# Patient Record
Sex: Female | Born: 1960 | Race: White | Hispanic: No | Marital: Married | Smoking: Never smoker
Health system: Southern US, Community
[De-identification: ages and names within clinical notes are randomized; demographics above are authoritative.]

## PROBLEM LIST (undated history)

## (undated) DIAGNOSIS — K219 Gastro-esophageal reflux disease without esophagitis: Secondary | ICD-10-CM

## (undated) DIAGNOSIS — G935 Compression of brain: Secondary | ICD-10-CM

## (undated) DIAGNOSIS — I10 Essential (primary) hypertension: Secondary | ICD-10-CM

## (undated) DIAGNOSIS — T4145XA Adverse effect of unspecified anesthetic, initial encounter: Secondary | ICD-10-CM

## (undated) DIAGNOSIS — G709 Myoneural disorder, unspecified: Secondary | ICD-10-CM

## (undated) DIAGNOSIS — T8859XA Other complications of anesthesia, initial encounter: Secondary | ICD-10-CM

## (undated) DIAGNOSIS — T41205A Adverse effect of unspecified general anesthetics, initial encounter: Secondary | ICD-10-CM

## (undated) DIAGNOSIS — Z8489 Family history of other specified conditions: Secondary | ICD-10-CM

## (undated) DIAGNOSIS — R51 Headache: Secondary | ICD-10-CM

## (undated) HISTORY — DX: Compression of brain: G93.5

## (undated) HISTORY — PX: MOUTH SURGERY: SHX715

## (undated) HISTORY — DX: Essential (primary) hypertension: I10

---

## 1999-02-03 ENCOUNTER — Encounter: Admission: RE | Admit: 1999-02-03 | Discharge: 1999-02-03 | Payer: Self-pay | Admitting: Obstetrics and Gynecology

## 1999-02-03 ENCOUNTER — Encounter: Payer: Self-pay | Admitting: Obstetrics and Gynecology

## 2001-06-14 ENCOUNTER — Emergency Department (HOSPITAL_COMMUNITY): Admission: EM | Admit: 2001-06-14 | Discharge: 2001-06-14 | Payer: Self-pay | Admitting: *Deleted

## 2003-05-12 ENCOUNTER — Ambulatory Visit (HOSPITAL_COMMUNITY): Admission: RE | Admit: 2003-05-12 | Discharge: 2003-05-12 | Payer: Self-pay | Admitting: Obstetrics and Gynecology

## 2006-01-19 ENCOUNTER — Emergency Department (HOSPITAL_COMMUNITY): Admission: EM | Admit: 2006-01-19 | Discharge: 2006-01-19 | Payer: Self-pay | Admitting: Emergency Medicine

## 2006-02-21 ENCOUNTER — Ambulatory Visit (HOSPITAL_COMMUNITY): Admission: RE | Admit: 2006-02-21 | Discharge: 2006-02-21 | Payer: Self-pay | Admitting: Obstetrics and Gynecology

## 2006-09-06 ENCOUNTER — Encounter: Admission: RE | Admit: 2006-09-06 | Discharge: 2006-10-18 | Payer: Self-pay | Admitting: Neurology

## 2009-05-10 ENCOUNTER — Encounter: Admission: RE | Admit: 2009-05-10 | Discharge: 2009-05-10 | Payer: Self-pay | Admitting: Family Medicine

## 2010-03-19 ENCOUNTER — Encounter: Payer: Self-pay | Admitting: Family Medicine

## 2011-05-31 ENCOUNTER — Ambulatory Visit (INDEPENDENT_AMBULATORY_CARE_PROVIDER_SITE_OTHER): Payer: BC Managed Care – PPO | Admitting: Gynecology

## 2011-05-31 ENCOUNTER — Ambulatory Visit: Payer: Self-pay | Admitting: Gynecology

## 2011-05-31 ENCOUNTER — Encounter: Payer: Self-pay | Admitting: Gynecology

## 2011-05-31 VITALS — BP 134/72 | Ht 60.25 in | Wt 211.0 lb

## 2011-05-31 DIAGNOSIS — N926 Irregular menstruation, unspecified: Secondary | ICD-10-CM

## 2011-05-31 DIAGNOSIS — Z01419 Encounter for gynecological examination (general) (routine) without abnormal findings: Secondary | ICD-10-CM

## 2011-05-31 DIAGNOSIS — N814 Uterovaginal prolapse, unspecified: Secondary | ICD-10-CM

## 2011-05-31 DIAGNOSIS — G935 Compression of brain: Secondary | ICD-10-CM | POA: Insufficient documentation

## 2011-05-31 NOTE — Progress Notes (Addendum)
Melanie, Oliver November 06, 1960 [id number]        51 y.o. G1 P1 new patient, former patient of Dr. Katherine Roan, presents for annual exam as well as complaining of some irregular menses over the past year where she will skip a month no intermenstrual bleeding and the feeling of something falling out of her vagina with pressure symptoms.  Using condoms and withdrawal method for contraception. Had previously been on oral contraceptives but stopped them last year due to her age and hypertension history. 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 Melbourne Abts.  Past medical history,surgical history, medications, allergies, family history and social history were all reviewed and documented in the EPIC chart. ROS:  Was performed and pertinent positives and negatives are included in the history.  Exam: Sherrilyn Rist chaperone present Filed Vitals:   05/31/11 1528  BP: 134/72   General appearance  Normal Skin grossly normal Head/Neck normal with no cervical or supraclavicular adenopathy thyroid normal Lungs  clear Cardiac RR, without RMG Abdominal  soft, nontender, without masses, organomegaly or hernia Breasts  examined lying and sitting without masses, retractions, discharge or axillary adenopathy. Pelvic  Ext/BUS/vagina  normal   Cervix  normal with prolapse to the introital opening  Uterus  Anteverted with prolapse to the introital opening, normal size, shape and contour, midline and mobile nontender   Adnexa  Without masses or tenderness    Anus and perineum  normal   Rectovaginal  normal sphincter tone without palpated masses or tenderness.   Assessment/Plan:  51 y.o. female for annual exam.    1. Uterine prolapse. I reviewed the situation with the patient. Options for management include observation, pessary trial, hysterectomy were reviewed. She does not have any incontinence history of urinary or fecal. She appears to have fair support vaginally without overt cystocele  or significant rectocele. Patient wants to think of her options and will follow up with her decision. 2. Pap smear. No Pap smear was done today. Her last Pap smear was 2012. She had numerous normal reports in her chart from Dr. Elana Alm has no history of abnormal Pap smears. I discussed current screening guidelines we'll plan every 3 year Pap smears. 3. Contraception. I reviewed the need for contraception even at age 70 and various options were reviewed. I do not think estrogen-containing contraception would be a good idea given her hypertension and age. She will continue to more consistently use condoms, Plan B availability reviewed.  4. Irregular menses. She does have skips throughout the year usually just one month intermittently. No real hot flashes night sweats or other symptoms. Check baseline labs to include TSH FSH prolactin as well as a qualitative hCG as her last menses was in February. 5. Chiari malformation type I. She'll continue to follow up with Dr. Melbourne Abts in reference to this. She is asymptomatic without headaches visual changes unsteadiness on her feet or any other symptom. 6. Mammography. She is overdue for mammogram and knows the importance of scheduling this and agrees to do so. SBE monthly reviewed. 7. Colonoscopy. She did turn 50 this year and asked her to schedule a colonoscopy this coming year she agrees to arrange this. 8. Health maintenance. No blood work was done today other than the hormone levels as it is all done through her primary physician's office who she sees on a regular basis and follows her for her hypertension.     Dara Lords MD, 4:24 PM 05/31/2011

## 2011-05-31 NOTE — Patient Instructions (Signed)
Follow up for hormone level results. Think of your options as far as the uterine prolapse and follow up with this with your decision.

## 2011-06-01 LAB — TSH: TSH: 1.403 u[IU]/mL (ref 0.350–4.500)

## 2011-06-21 ENCOUNTER — Ambulatory Visit: Payer: Self-pay | Admitting: Gynecology

## 2011-06-26 ENCOUNTER — Ambulatory Visit: Payer: Self-pay | Admitting: Gynecology

## 2011-06-27 ENCOUNTER — Encounter: Payer: Self-pay | Admitting: Gynecology

## 2011-06-27 ENCOUNTER — Ambulatory Visit (INDEPENDENT_AMBULATORY_CARE_PROVIDER_SITE_OTHER): Payer: BC Managed Care – PPO | Admitting: Gynecology

## 2011-06-27 DIAGNOSIS — N814 Uterovaginal prolapse, unspecified: Secondary | ICD-10-CM

## 2011-06-27 NOTE — Progress Notes (Signed)
Patient presents wanting to proceed with hysterectomy and had some questions as far as was involved with the surgery and recovery period. She has symptomatic uterine prolapse without significant cystocele/rectocele and a normal size uterus. She has no urinary or fecal complaints.  Her FSH is elevated consistent with perimenopause and she still is having menses although becoming somewhat irregular. She does not have significant menopausal symptoms.  I reviewed with her my recommendation to proceed with total vaginal hysterectomy. Possibilities for laparoscopic assistance or TAH reviewed if unexpected adhesions or anatomic changes or complications arise. She understands that this would mean additional incisions and longer recovery. The ovarian conservation issue was reviewed particularly at her age with an elevated FSH. She still is having menses in the issues of advantage even in the perimenopausal keeping her ovaries from a health standpoint to include cardiovascular and bone health versus removing her ovaries and the issue of possible symptoms requiring ERT and the risks of ERT as well as the acceleration of cardiovascular risk and osteoporotic risk. Patient has no family history of ovarian cancer and after lengthy discussion she prefers to keep her ovaries. The expected intraoperative/postoperative courses in the recovery period was reviewed. Absolute irreversible sterility associated with hysterectomy discussed as well as sexuality and the potential for persistent orgasmic dysfunction and persistent dyspareunia reviewed.  The risks of infection, prolonged antibiotics, abscess/hematoma formation requiring reoperation to drain, hemorrhage necessitating transfusion and the risks of transfusion to include transfusion reaction, hepatitis, HIV, mad cow disease and other unknown entities all reviewed understood and accepted. The risk of inadvertent injury to internal organs, either immediately recognized or delay  recognized, including bowel, bladder, ureters, nerves necessitating major exploratory reparative surgeries and future reparative surgeries including ostomy formation, bowel resection, bladder repair, ureteral damage repair was all discussed understood and accepted. The patient wants to schedule the surgery and represent for a preoperative consult before hand. I did ask her to call Dr. Sandria Manly her neurologist in reference to her Chiari malformation just for a preoperative clearance and she agrees to do so. She does have recent blood work that shows a normal hemoglobin of 13 and normal electrolytes and renal functions.

## 2011-06-27 NOTE — Patient Instructions (Signed)
Office will call you to arrange surgery. 

## 2011-06-28 ENCOUNTER — Telehealth: Payer: Self-pay | Admitting: Gynecology

## 2011-06-28 NOTE — Telephone Encounter (Signed)
Left message to call me regarding scheduling her surgery.

## 2011-08-13 ENCOUNTER — Encounter: Payer: Self-pay | Admitting: Gynecology

## 2011-08-13 ENCOUNTER — Ambulatory Visit (INDEPENDENT_AMBULATORY_CARE_PROVIDER_SITE_OTHER): Payer: BC Managed Care – PPO | Admitting: Gynecology

## 2011-08-13 VITALS — BP 132/86

## 2011-08-13 DIAGNOSIS — N926 Irregular menstruation, unspecified: Secondary | ICD-10-CM

## 2011-08-13 DIAGNOSIS — N814 Uterovaginal prolapse, unspecified: Secondary | ICD-10-CM

## 2011-08-13 NOTE — Patient Instructions (Signed)
Followup for surgery as scheduled. 

## 2011-08-13 NOTE — H&P (Signed)
Melanie Oliver [birth year] [id number]   History and Physical   Chief complaint: irregular menses, symptomatic uterine prolapse  History of present illness: 51 y.o.  G1 P84 female with mild menstrual irregularity with skipped menses intermittently this past year with elevated FSH presented complaining of "something falling out of the vagina" with uncomfortable/unacceptable pressure symptoms.  Exam shows uterine prolapse with cervix protruding from the vagina with straining. No overt evidence of cystocele/rectocele. Options for management include observation, pessary, surgery were reviewed and patient prefers surgical treatment. Patient's admitted for [facility].   Past medical history,surgical history, medications, allergies, family history and social history were all reviewed and documented in the EPIC chart. ROS:  Was performed and pertinent positives and negatives are included in the history of present illness.  Exam: General: well developed, well nourished female, no acute distress HEENT: normal  Lungs: clear to auscultation without wheezing, rales or rhonchi  Cardiac: regular rate without rubs, murmurs or gallops  Abdomen: soft, nontender without masses, guarding, rebound, organomegaly  Pelvic: external bus vagina: normal without evidence of gross cystocele  Cervix: grossly normal with protruding from the introital opening with straining Uterus: normal size, midline and mobile, nontender with second degree prolapse Adnexa: without masses or tenderness  Rectovaginal exam within normal limits without evidence of gross rectocele    Assessment/Plan:  51 year old G1 P1 perimenopausal patient with symptomatic uterine prolapse without significant cystocele/rectocele and a normal size uterus. She has no urinary or fecal complaints. Her FSH is elevated consistent with perimenopause and she still is having menses although becoming somewhat irregular. She does not have significant menopausal  symptoms. Options for management include observation, trial of pessary and total vaginal hysterectom ywere reviewed the patient was proceed with hysterectomy. The expected intraoperative/postoperative courses as well as the recovery period was discussed with her.  Possibilities for laparoscopic assistance or TAH reviewed if unexpected adhesions or anatomic changes or complications arise. She understands that this would mean additional incisions and longer recovery. The ovarian conservation issue was reviewed particularly at her age with an elevated FSH. She still is having menses and the issues of advantage even in the perimenopause keeping her ovaries from a health standpoint to include cardiovascular and bone health versus removing her ovaries and the issue of possible symptoms requiring ERT and the risks of ERT as well as the acceleration of cardiovascular risk and osteoporotic risk. Patient has no family history of ovarian cancer and after lengthy discussion she prefers to keep her ovaries. Patient understands that she'll be at risk for benign ovarian disease possibly requiring treatment in the future as well as the risk of ovarian cancer by keeping her ovaries and she understands and accepts this. She does give me permission to remove one or both ovaries if at the time of surgery significant disease is encountered or it is my best recommendation intraoperatively to do so if she would accept this.  Absolute irreversible sterility associated with hysterectomy was discussed as well as sexuality and the potential for persistent orgasmic dysfunction and persistent dyspareunia reviewed. The risks of infection, prolonged antibiotics, abscess/hematoma formation requiring reoperation to drain, hemorrhage necessitating transfusion and the risks of transfusion to include transfusion reaction, hepatitis, HIV, mad cow disease and other unknown entities all reviewed understood and accepted. The risk of inadvertent injury to  internal organs, either immediately recognized or delay recognized, including bowel, bladder, ureters, nerves necessitating major exploratory reparative surgeries and future reparative surgeries including ostomy formation, bowel resection, bladder repair, ureteral damage repair was all  discussed understood and accepted. Patient is ready to proceed with surgery and her questions were answered to her satisfaction. She relates having been cleared by Dr. Sandria Manly as far as her Chiari malformation in regards to surgery.     Dara Lords MD, 4:28 PM 08/13/2011

## 2011-08-13 NOTE — Progress Notes (Signed)
Melanie Oliver 05/06/1960 [id number]   Preoperative consult   Chief complaint: irregular menses, symptomatic uterine prolapse  History of present illness: 51 y.o.  G1 P73 female with mild menstrual irregularity with skipped menses intermittently this past year with elevated FSH presented complaining of "something falling out of the vagina" with uncomfortable/unacceptable pressure symptoms.  Exam shows uterine prolapse with cervix protruding from the vagina with straining. No overt evidence of cystocele/rectocele. Options for management include observation, pessary, surgery were reviewed and patient prefers surgical treatment. Patient's admitted for [facility].   Past medical history,surgical history, medications, allergies, family history and social history were all reviewed and documented in the EPIC chart. ROS:  Was performed and pertinent positives and negatives are included in the history of present illness.  Exam: General: well developed, well nourished female, no acute distress HEENT: normal  Lungs: clear to auscultation without wheezing, rales or rhonchi  Cardiac: regular rate without rubs, murmurs or gallops  Abdomen: soft, nontender without masses, guarding, rebound, organomegaly  Pelvic: external bus vagina: normal without evidence of gross cystocele  Cervix: grossly normal with protruding from the introital opening with straining Uterus: normal size, midline and mobile, nontender with second degree prolapse Adnexa: without masses or tenderness  Rectovaginal exam within normal limits without evidence of gross rectocele    Assessment/Plan:  51 year old G1 P1 perimenopausal patient with symptomatic uterine prolapse without significant cystocele/rectocele and a normal size uterus. She has no urinary or fecal complaints. Her FSH is elevated consistent with perimenopause and she still is having menses although becoming somewhat irregular. She does not have significant menopausal symptoms.  Options for management include observation, trial of pessary and total vaginal hysterectom ywere reviewed the patient was proceed with hysterectomy. The expected intraoperative/postoperative courses as well as the recovery period was discussed with her.  Possibilities for laparoscopic assistance or TAH reviewed if unexpected adhesions or anatomic changes or complications arise. She understands that this would mean additional incisions and longer recovery. The ovarian conservation issue was reviewed particularly at her age with an elevated FSH. She still is having menses and the issues of advantage even in the perimenopause keeping her ovaries from a health standpoint to include cardiovascular and bone health versus removing her ovaries and the issue of possible symptoms requiring ERT and the risks of ERT as well as the acceleration of cardiovascular risk and osteoporotic risk. Patient has no family history of ovarian cancer and after lengthy discussion she prefers to keep her ovaries. Patient understands that she'll be at risk for benign ovarian disease possibly requiring treatment in the future as well as the risk of ovarian cancer by keeping her ovaries and she understands and accepts this. She does give me permission to remove one or both ovaries if at the time of surgery significant disease is encountered or it is my best recommendation intraoperatively to do so if she would accept this.  Absolute irreversible sterility associated with hysterectomy was discussed as well as sexuality and the potential for persistent orgasmic dysfunction and persistent dyspareunia reviewed. The risks of infection, prolonged antibiotics, abscess/hematoma formation requiring reoperation to drain, hemorrhage necessitating transfusion and the risks of transfusion to include transfusion reaction, hepatitis, HIV, mad cow disease and other unknown entities all reviewed understood and accepted. The risk of inadvertent injury to internal  organs, either immediately recognized or delay recognized, including bowel, bladder, ureters, nerves necessitating major exploratory reparative surgeries and future reparative surgeries including ostomy formation, bowel resection, bladder repair, ureteral damage repair was all discussed  understood and accepted. Patient is ready to proceed with surgery and her questions were answered to her satisfaction. She relates having been cleared by Dr. Sandria Manly as far as her Chiari malformation in regards to surgery.     Dara Lords MD, 4:18 PM 08/13/2011

## 2011-08-17 ENCOUNTER — Encounter (HOSPITAL_COMMUNITY): Payer: Self-pay | Admitting: Pharmacist

## 2011-08-22 ENCOUNTER — Telehealth: Payer: Self-pay | Admitting: *Deleted

## 2011-08-22 NOTE — Telephone Encounter (Signed)
Pt informed with the below note, pt will watch for now.

## 2011-08-22 NOTE — Telephone Encounter (Signed)
Unless it's a recurrent issue then I would watch for now. The issue if it would be recurrent ss whether she would want to have bladder surgery at the time of her hysterectomy. If she wants to consider that then we would have to cancel her surgery have her see the urologist and reschedule surgery if they feel bladder surgery indicated we could do as a combined procedure. Alternative would be to monitor her symptoms now if it would be an issue in the future she could have a separate bladder surgery.

## 2011-08-22 NOTE — Telephone Encounter (Signed)
Pt is scheduled for hysterectomy on 08/27/11, pt said at last couple of office visits you asked if she had any incontinence problems. Pt said she noticed this weekend while laugh she did have some urine leakage. Pt wanted me to relay this information to you. Please advise

## 2011-08-24 ENCOUNTER — Encounter (HOSPITAL_COMMUNITY)
Admission: RE | Admit: 2011-08-24 | Discharge: 2011-08-24 | Disposition: A | Discharge status: Home / Self Care | Payer: BC Managed Care – PPO | Source: Ambulatory Visit | Attending: Gynecology | Admitting: Gynecology

## 2011-08-24 ENCOUNTER — Other Ambulatory Visit: Payer: Self-pay

## 2011-08-24 ENCOUNTER — Encounter (HOSPITAL_COMMUNITY): Payer: Self-pay

## 2011-08-24 HISTORY — DX: Adverse effect of unspecified anesthetic, initial encounter: T41.45XA

## 2011-08-24 HISTORY — DX: Adverse effect of unspecified general anesthetics, initial encounter: T41.205A

## 2011-08-24 HISTORY — DX: Headache: R51

## 2011-08-24 HISTORY — DX: Gastro-esophageal reflux disease without esophagitis: K21.9

## 2011-08-24 HISTORY — DX: Family history of other specified conditions: Z84.89

## 2011-08-24 HISTORY — DX: Myoneural disorder, unspecified: G70.9

## 2011-08-24 HISTORY — DX: Other complications of anesthesia, initial encounter: T88.59XA

## 2011-08-24 LAB — SURGICAL PCR SCREEN
MRSA, PCR: NEGATIVE
Staphylococcus aureus: NEGATIVE

## 2011-08-24 LAB — CBC
MCV: 89.2 fL (ref 78.0–100.0)
Platelets: 384 10*3/uL (ref 150–400)
RDW: 12.7 % (ref 11.5–15.5)
WBC: 10.8 10*3/uL — ABNORMAL HIGH (ref 4.0–10.5)

## 2011-08-24 LAB — COMPREHENSIVE METABOLIC PANEL
ALT: 22 U/L (ref 0–35)
AST: 22 U/L (ref 0–37)
Alkaline Phosphatase: 92 U/L (ref 39–117)
Chloride: 97 mEq/L (ref 96–112)
GFR calc non Af Amer: >90 mL/min (ref >90)
Glucose, Bld: 100 mg/dL — ABNORMAL HIGH (ref 70–99)
Total Bilirubin: 0.9 mg/dL (ref 0.3–1.2)
Total Protein: 7.5 g/dL (ref 6.0–8.3)

## 2011-08-24 NOTE — Patient Instructions (Signed)
YOUR PROCEDURE IS SCHEDULED ON:08/27/11  ENTER THROUGH THE MAIN ENTRANCE OF [facility] AT:6am  USE DESK PHONE AND DIAL [phone] TO INFORM us OF YOUR ARRIVAL  CALL [phone] IF YOU HAVE ANY QUESTIONS OR PROBLEMS PRIOR TO YOUR ARRIVAL.  REMEMBER: DO NOT EAT OR DRINK AFTER MIDNIGHT : Sunday     YOU MAY BRUSH YOUR TEETH THE MORNING OF SURGERY   TAKE THESE MEDICINES THE DAY OF SURGERY WITH SIP OF WATER:BP med, and Nexium   DO NOT WEAR JEWELRY, EYE MAKEUP, LIPSTICK OR DARK FINGERNAIL POLISH DO NOT WEAR LOTIONS  DO NOT SHAVE FOR 48 HOURS PRIOR TO SURGERY  YOU WILL NOT BE ALLOWED TO DRIVE YOURSELF HOME.

## 2011-08-24 NOTE — Pre-Procedure Instructions (Signed)
Dr. Sheral Apley notified of pt having dx of Chiari malformation and per her neurologist, she should avoid having her neck and head over extended during intubation.

## 2011-08-26 MED ORDER — DEXTROSE 5 % IV SOLN
2.0000 g | INTRAVENOUS | Status: AC
  Administered 2011-08-27: 2 g via INTRAVENOUS
  Filled 2011-08-26: qty 2

## 2011-08-27 ENCOUNTER — Encounter (HOSPITAL_COMMUNITY): Payer: Self-pay | Admitting: Anesthesiology

## 2011-08-27 ENCOUNTER — Encounter (HOSPITAL_COMMUNITY): Payer: Self-pay | Admitting: *Deleted

## 2011-08-27 ENCOUNTER — Encounter (HOSPITAL_COMMUNITY)
Admission: RE | Disposition: A | Discharge status: Home / Self Care | Payer: Self-pay | Source: Ambulatory Visit | Attending: Gynecology

## 2011-08-27 ENCOUNTER — Ambulatory Visit (HOSPITAL_COMMUNITY): Payer: BC Managed Care – PPO | Admitting: Anesthesiology

## 2011-08-27 ENCOUNTER — Ambulatory Visit (HOSPITAL_COMMUNITY)
Admission: RE | Admit: 2011-08-27 | Discharge: 2011-08-28 | Disposition: A | Discharge status: Home / Self Care | Payer: BC Managed Care – PPO | Source: Ambulatory Visit | Attending: Gynecology | Admitting: Gynecology

## 2011-08-27 DIAGNOSIS — N814 Uterovaginal prolapse, unspecified: Secondary | ICD-10-CM

## 2011-08-27 DIAGNOSIS — D259 Leiomyoma of uterus, unspecified: Secondary | ICD-10-CM | POA: Insufficient documentation

## 2011-08-27 DIAGNOSIS — Z01812 Encounter for preprocedural laboratory examination: Secondary | ICD-10-CM | POA: Insufficient documentation

## 2011-08-27 DIAGNOSIS — Z01818 Encounter for other preprocedural examination: Secondary | ICD-10-CM | POA: Insufficient documentation

## 2011-08-27 HISTORY — PX: VAGINAL HYSTERECTOMY: SHX2639

## 2011-08-27 SURGERY — HYSTERECTOMY, VAGINAL
Anesthesia: General | Site: Vagina | Wound class: Clean Contaminated

## 2011-08-27 MED ORDER — HYDROMORPHONE HCL PF 1 MG/ML IJ SOLN
INTRAMUSCULAR | Status: AC
  Administered 2011-08-27: 0.25 mg via INTRAVENOUS
  Filled 2011-08-27: qty 1

## 2011-08-27 MED ORDER — KETOROLAC TROMETHAMINE 30 MG/ML IJ SOLN: 30.0000 mg | Freq: Four times a day (QID) | INTRAMUSCULAR | Status: DC

## 2011-08-27 MED ORDER — ROCURONIUM BROMIDE 50 MG/5ML IV SOLN
INTRAVENOUS | Status: AC
  Filled 2011-08-27: qty 1

## 2011-08-27 MED ORDER — SCOPOLAMINE 1 MG/3DAYS TD PT72
MEDICATED_PATCH | TRANSDERMAL | Status: AC
  Administered 2011-08-27: 1.5 mg via TRANSDERMAL
  Filled 2011-08-27: qty 1

## 2011-08-27 MED ORDER — PROPOFOL 10 MG/ML IV EMUL
INTRAVENOUS | Status: DC | PRN
  Administered 2011-08-27: 160 mg via INTRAVENOUS

## 2011-08-27 MED ORDER — MIDAZOLAM HCL 5 MG/5ML IJ SOLN
INTRAMUSCULAR | Status: DC | PRN
  Administered 2011-08-27: 2 mg via INTRAVENOUS

## 2011-08-27 MED ORDER — MIDAZOLAM HCL 2 MG/2ML IJ SOLN
INTRAMUSCULAR | Status: AC
  Filled 2011-08-27: qty 2

## 2011-08-27 MED ORDER — ONDANSETRON HCL 4 MG/2ML IJ SOLN
INTRAMUSCULAR | Status: DC | PRN
  Administered 2011-08-27: 4 mg via INTRAVENOUS

## 2011-08-27 MED ORDER — OXYCODONE-ACETAMINOPHEN 5-325 MG PO TABS
1.0000 | ORAL_TABLET | ORAL | Status: DC | PRN
  Administered 2011-08-27 – 2011-08-28 (×4): 1 via ORAL
  Filled 2011-08-27 (×4): qty 1

## 2011-08-27 MED ORDER — LIDOCAINE HCL (CARDIAC) 20 MG/ML IV SOLN
INTRAVENOUS | Status: AC
  Filled 2011-08-27: qty 5

## 2011-08-27 MED ORDER — LACTATED RINGERS IV SOLN
INTRAVENOUS | Status: DC
  Administered 2011-08-27 (×3): via INTRAVENOUS

## 2011-08-27 MED ORDER — GLYCOPYRROLATE 0.2 MG/ML IJ SOLN
INTRAMUSCULAR | Status: DC | PRN
  Administered 2011-08-27: 0.1 mg via INTRAVENOUS
  Administered 2011-08-27: 1 mg via INTRAVENOUS

## 2011-08-27 MED ORDER — SCOPOLAMINE 1 MG/3DAYS TD PT72
1.0000 | MEDICATED_PATCH | Freq: Once | TRANSDERMAL | Status: DC
  Administered 2011-08-27: 1.5 mg via TRANSDERMAL

## 2011-08-27 MED ORDER — FENTANYL CITRATE 0.05 MG/ML IJ SOLN
INTRAMUSCULAR | Status: AC
  Filled 2011-08-27: qty 5

## 2011-08-27 MED ORDER — GLYCOPYRROLATE 0.2 MG/ML IJ SOLN
INTRAMUSCULAR | Status: AC
  Filled 2011-08-27: qty 2

## 2011-08-27 MED ORDER — LIDOCAINE HCL (CARDIAC) 20 MG/ML IV SOLN
INTRAVENOUS | Status: DC | PRN
  Administered 2011-08-27: 60 mg via INTRAVENOUS

## 2011-08-27 MED ORDER — DIPHENHYDRAMINE HCL 25 MG PO CAPS
50.0000 mg | ORAL_CAPSULE | Freq: Four times a day (QID) | ORAL | Status: DC | PRN
  Filled 2011-08-27: qty 1

## 2011-08-27 MED ORDER — HYDROMORPHONE HCL PF 1 MG/ML IJ SOLN
0.2500 mg | INTRAMUSCULAR | Status: DC | PRN
  Administered 2011-08-27 (×2): 0.25 mg via INTRAVENOUS

## 2011-08-27 MED ORDER — ROCURONIUM BROMIDE 100 MG/10ML IV SOLN
INTRAVENOUS | Status: DC | PRN
  Administered 2011-08-27: 35 mg via INTRAVENOUS
  Administered 2011-08-27: 10 mg via INTRAVENOUS
  Administered 2011-08-27: 5 mg via INTRAVENOUS

## 2011-08-27 MED ORDER — ONDANSETRON HCL 4 MG/2ML IJ SOLN
INTRAMUSCULAR | Status: AC
  Filled 2011-08-27: qty 2

## 2011-08-27 MED ORDER — DEXTROSE-NACL 5-0.9 % IV SOLN: INTRAVENOUS | Status: DC

## 2011-08-27 MED ORDER — LIDOCAINE-EPINEPHRINE 1 %-1:100000 IJ SOLN
INTRAMUSCULAR | Status: DC | PRN
  Administered 2011-08-27: 10 mL

## 2011-08-27 MED ORDER — KETOROLAC TROMETHAMINE 30 MG/ML IJ SOLN
INTRAMUSCULAR | Status: AC
  Administered 2011-08-27: 30 mg via INTRAVENOUS
  Filled 2011-08-27: qty 1

## 2011-08-27 MED ORDER — NEOSTIGMINE METHYLSULFATE 1 MG/ML IJ SOLN
INTRAMUSCULAR | Status: AC
  Filled 2011-08-27: qty 10

## 2011-08-27 MED ORDER — MORPHINE SULFATE 4 MG/ML IJ SOLN: 2.0000 mg | INTRAMUSCULAR | Status: DC | PRN

## 2011-08-27 MED ORDER — NEOSTIGMINE METHYLSULFATE 1 MG/ML IJ SOLN
INTRAMUSCULAR | Status: DC | PRN
  Administered 2011-08-27: 5 mg via INTRAVENOUS

## 2011-08-27 MED ORDER — FENTANYL CITRATE 0.05 MG/ML IJ SOLN
INTRAMUSCULAR | Status: DC | PRN
  Administered 2011-08-27: 50 ug via INTRAVENOUS
  Administered 2011-08-27: 100 ug via INTRAVENOUS

## 2011-08-27 MED ORDER — PROPOFOL 10 MG/ML IV EMUL
INTRAVENOUS | Status: AC
  Filled 2011-08-27: qty 20

## 2011-08-27 MED ORDER — KETOROLAC TROMETHAMINE 30 MG/ML IJ SOLN
15.0000 mg | Freq: Once | INTRAMUSCULAR | Status: AC | PRN
  Administered 2011-08-27: 30 mg via INTRAVENOUS

## 2011-08-27 MED ORDER — KETOROLAC TROMETHAMINE 30 MG/ML IJ SOLN
30.0000 mg | Freq: Four times a day (QID) | INTRAMUSCULAR | Status: DC
  Administered 2011-08-27 – 2011-08-28 (×3): 30 mg via INTRAVENOUS
  Filled 2011-08-27 (×3): qty 1

## 2011-08-27 MED ORDER — 0.9 % SODIUM CHLORIDE (POUR BTL) OPTIME
TOPICAL | Status: DC | PRN
  Administered 2011-08-27: 1000 mL

## 2011-08-27 MED ORDER — ONDANSETRON HCL 4 MG PO TABS: 4.0000 mg | ORAL_TABLET | Freq: Four times a day (QID) | ORAL | Status: DC | PRN

## 2011-08-27 MED ORDER — EPHEDRINE SULFATE 50 MG/ML IJ SOLN
INTRAMUSCULAR | Status: DC | PRN
  Administered 2011-08-27: 10 mg via INTRAVENOUS

## 2011-08-27 MED ORDER — DEXTROSE-NACL 5-0.9 % IV SOLN
INTRAVENOUS | Status: DC
  Administered 2011-08-27 (×2): via INTRAVENOUS

## 2011-08-27 MED ORDER — DEXAMETHASONE SODIUM PHOSPHATE 10 MG/ML IJ SOLN
INTRAMUSCULAR | Status: AC
  Filled 2011-08-27: qty 1

## 2011-08-27 MED ORDER — DEXAMETHASONE SODIUM PHOSPHATE 10 MG/ML IJ SOLN
INTRAMUSCULAR | Status: DC | PRN
  Administered 2011-08-27: 10 mg via INTRAVENOUS

## 2011-08-27 SURGICAL SUPPLY — 25 items
CANISTER SUCTION 2500CC (MISCELLANEOUS) ×2 IMPLANT
CLOTH BEACON ORANGE TIMEOUT ST (SAFETY) ×2 IMPLANT
CONT PATH 16OZ SNAP LID 3702 (MISCELLANEOUS) ×2 IMPLANT
CONTAINER PREFILL 10% NBF 60ML (FORM) IMPLANT
DECANTER SPIKE VIAL GLASS SM (MISCELLANEOUS) ×2 IMPLANT
GLOVE BIO SURGEON STRL SZ7.5 (GLOVE) ×4 IMPLANT
GLOVE BIOGEL PI IND STRL 6.5 (GLOVE) ×1 IMPLANT
GLOVE BIOGEL PI INDICATOR 6.5 (GLOVE) ×1
GOWN PREVENTION PLUS LG XLONG (DISPOSABLE) ×6 IMPLANT
GOWN STRL REIN XL XLG (GOWN DISPOSABLE) ×4 IMPLANT
NEEDLE HYPO 22GX1.5 SAFETY (NEEDLE) IMPLANT
NEEDLE MAYO .5 CIRCLE (NEEDLE) IMPLANT
NEEDLE SPNL 18GX3.5 QUINCKE PK (NEEDLE) ×2 IMPLANT
NEEDLE SPNL 22GX3.5 QUINCKE BK (NEEDLE) ×2 IMPLANT
NS IRRIG 1000ML POUR BTL (IV SOLUTION) ×2 IMPLANT
PACK VAGINAL WOMENS (CUSTOM PROCEDURE TRAY) ×2 IMPLANT
SUT VIC AB 0 CT1 18XCR BRD8 (SUTURE) ×2 IMPLANT
SUT VIC AB 0 CT1 36 (SUTURE) ×2 IMPLANT
SUT VIC AB 0 CT1 8-18 (SUTURE) ×2
SUT VIC AB 2-0 SH 27 (SUTURE)
SUT VIC AB 2-0 SH 27XBRD (SUTURE) IMPLANT
SUT VICRYL 0 TIES 12 18 (SUTURE) ×2 IMPLANT
TOWEL OR 17X24 6PK STRL BLUE (TOWEL DISPOSABLE) ×4 IMPLANT
TRAY FOLEY CATH 14FR (SET/KITS/TRAYS/PACK) ×2 IMPLANT
WATER STERILE IRR 1000ML POUR (IV SOLUTION) IMPLANT

## 2011-08-27 NOTE — Anesthesia Preprocedure Evaluation (Addendum)
Anesthesia Evaluation  Patient identified by MRN, date of birth, ID band Patient awake    Reviewed: Allergy & Precautions, H&P , NPO status , Patient's Chart, lab work & pertinent test results, reviewed documented beta blocker date and time   Airway Mallampati: I TM Distance: >3 FB Neck ROM: full    Dental  (+) Teeth Intact   Pulmonary neg pulmonary ROS,  breath sounds clear to auscultation  Pulmonary exam normal       Cardiovascular hypertension (took BP med today), On Medications Rhythm:regular Rate:Normal     Neuro/Psych  Headaches (migraines), Chiari I malformation.  Asymptomatic at present.  Per neurologist, anesthesia should refrain from overextending her head and neck during intubation. negative psych ROS   GI/Hepatic negative GI ROS, Neg liver ROS, GERD- (took nexium today)  Medicated,  Endo/Other  Morbid obesity  Renal/GU negative Renal ROS     Musculoskeletal   Abdominal   Peds  Hematology negative hematology ROS (+)   Anesthesia Other Findings Caution with neck positioning during intubation  Reproductive/Obstetrics negative OB ROS                         Anesthesia Physical Anesthesia Plan  ASA: III  Anesthesia Plan: General ETT   Post-op Pain Management:    Induction:   Airway Management Planned:   Additional Equipment:   Intra-op Plan:   Post-operative Plan:   Informed Consent: I have reviewed the patients History and Physical, chart, labs and discussed the procedure including the risks, benefits and alternatives for the proposed anesthesia with the patient or authorized representative who has indicated his/her understanding and acceptance.   Dental Advisory Given  Plan Discussed with: CRNA and Surgeon  Anesthesia Plan Comments:         Anesthesia Quick Evaluation

## 2011-08-27 NOTE — Anesthesia Postprocedure Evaluation (Signed)
  Anesthesia Post-op Note  Patient: Melanie Oliver  Procedure(s) Performed: Procedure(s) (LRB): HYSTERECTOMY VAGINAL (N/A)  Patient Location: [facility]  Anesthesia Type: General  Level of Consciousness: sedated  Airway and Oxygen Therapy: Patient Spontanous Breathing and Patient connected to nasal cannula oxygen  Post-op Pain: mild  Post-op Assessment: Post-op Vital signs reviewed  Post-op Vital Signs: Reviewed and stable  Complications: No apparent anesthesia complications

## 2011-08-27 NOTE — Anesthesia Postprocedure Evaluation (Signed)
  Anesthesia Post-op Note  Patient: Melanie Oliver  Procedure(s) Performed: Procedure(s) (LRB): HYSTERECTOMY VAGINAL (N/A)  Patient Location: PACU  Anesthesia Type: General  Level of Consciousness: awake, alert  and oriented  Airway and Oxygen Therapy: Patient Spontanous Breathing  Post-op Pain: mild  Post-op Assessment: Post-op Vital signs reviewed, Patient's Cardiovascular Status Stable, Respiratory Function Stable, Patent Airway, No signs of Nausea or vomiting and Pain level controlled  Post-op Vital Signs: Reviewed and stable  Complications: No apparent anesthesia complications

## 2011-08-27 NOTE — Op Note (Signed)
Melanie Oliver 1960/12/01 [id number]   Post Operative Note   Date of surgery:  08/27/2011  Pre Op Dx:  Uterine prolapse  Post Op Dx:  Uterine prolapse  Procedure:  Total vaginal hysterectomy  Surgeon:  Dara Lords  Assistant:  Reynaldo Minium  Anesthesia:  General  EBL:  50 cc  Complications:  None  Specimen:  uterus to pathology  Findings: EUA:  External BUS vagina with cervix at introitus opening. Cervix grossly normal. Uterus normal size midline mobile. Adnexa without masses.   Operative:  Uterus grossly normal. Right and left fallopian tubes normal. Right and left ovaries normal. Cul-de-sac grossly normal to limited inspection without evidence of endometriosis or adhesions.  Procedure:  Patient was taken to the operating room, underwent general anesthesia, placed in the dorsolithotomy position, received a vaginal/perineal preparation with Betadine solution, EUA performed and a Foley catheter was placed. The patient was draped in the usual fashion and a time out was performed by the surgical team. The cervix was visualized with a weighted speculum and the anterior lip grasped with a single-tooth tenaculum and the cervical mucosa was then circumferentially injected using lidocaine/epinephrine mixture a total of 10 cc. The cervical mucosa was then circumferentially incised and the paracervical planes were sharply developed. The posterior cul-de-sac was sharply entered without difficulty and a long weighted speculum was placed. The right and left uterosacral ligaments were identified, clamped, cut and ligated using 0 Vicryl suture and tagged for future reference. The anterior cul-de-sac was then sharply entered without difficulty and the uterus was then progressively freed from its attachments to clamping, cutting and ligating of the cardinal ligaments and parametrial tissues using 0 Vicryl suture.  The uterus was then delivered through the vagina and the uterine ovarian pedicles  were then clamped bilaterally and the specimen excised and sent to pathology. The uterine ovarian pedicles were then doubly ligated using 0 Vicryl suture in simple stitch followed by suture ligature. The long weighted speculum was replaced with a shorter weighted speculum, the posterior vaginal cuff grasped with an Allis clamp, the intestines packed from the cul-de-sac using a tag tell sponge and the cul-de-sac was irrigated showing adequate hemostasis. The posterior vaginal cuff was run from uterosacral ligament to uterosacral ligament using 0 Vicryl suture in a running interlocking stitch. The vaginal packing was removed, the cul-de-sac again irrigated showing adequate hemostasis and the vagina was then closed anterior to posterior in interrupted figure-of-eight 0 Vicryl sutures.  The vagina was irrigated showing adequate hemostasis. Clear yellow urine was noted in the Foley catheter. The patient was placed in the supine position, received intraoperative Toradol and was awakened without difficulty and taken to the recovery room in good condition having tolerated the procedure well.    Dara Lords MD, 8:53 AM 08/27/2011

## 2011-08-27 NOTE — Progress Notes (Signed)
DOS S/P TVH  Awake, alert with minimal pain Abd soft nontender  Results of surgery reviewed with patient and family.  Post op instructions reviewed.  Routine PO care with D/C in AM if continues well.

## 2011-08-27 NOTE — H&P (Signed)
  The patient was examined.  I reviewed the proposed surgery and consent form with the patient.  The dictated history and physical is current and accurate and all questions were answered. The patient is ready to proceed with surgery and has a realistic understanding and expectation for the outcome.   Dara Lords MD, 7:09 AM 08/27/2011

## 2011-08-27 NOTE — Anesthesia Procedure Notes (Signed)
Procedure Name: Intubation Date/Time: 08/27/2011 7:33 AM Performed by: Graciela Husbands Pre-anesthesia Checklist: Suction available, Timeout performed, Emergency Drugs available, Patient identified and Patient being monitored Patient Re-evaluated:Patient Re-evaluated prior to inductionOxygen Delivery Method: Circle system utilized Preoxygenation: Pre-oxygenation with 100% oxygen Intubation Type: IV induction Ventilation: Mask ventilation without difficulty Tube size: 7.0 mm Number of attempts: 1 Airway Equipment and Method: Stylet Placement Confirmation: ETT inserted through vocal cords under direct vision,  positive ETCO2 and breath sounds checked- equal and bilateral Secured at: 20.5 cm Tube secured with: Tape Dental Injury: Teeth and Oropharynx as per pre-operative assessment

## 2011-08-27 NOTE — Transfer of Care (Signed)
Immediate Anesthesia Transfer of Care Note  Patient: Melanie Oliver  Procedure(s) Performed: Procedure(s) (LRB): HYSTERECTOMY VAGINAL (N/A)  Patient Location: PACU  Anesthesia Type: General  Level of Consciousness: awake, alert  and oriented  Airway & Oxygen Therapy: Patient Spontanous Breathing and Patient connected to nasal cannula oxygen  Post-op Assessment: Report given to PACU RN and Post -op Vital signs reviewed and stable  Post vital signs: Reviewed and stable  Complications: No apparent anesthesia complications

## 2011-08-27 NOTE — Addendum Note (Signed)
Addendum  created 08/27/11 1419 by Algis Greenhouse, CRNA   Modules edited:Notes Section

## 2011-08-28 ENCOUNTER — Encounter (HOSPITAL_COMMUNITY): Payer: Self-pay | Admitting: Gynecology

## 2011-08-28 LAB — CBC
Platelets: 330 10*3/uL (ref 150–400)
RBC: 3.9 MIL/uL (ref 3.87–5.11)
RDW: 13.1 % (ref 11.5–15.5)
WBC: 14.9 10*3/uL — ABNORMAL HIGH (ref 4.0–10.5)

## 2011-08-28 MED ORDER — OXYCODONE-ACETAMINOPHEN 5-325 MG PO TABS: 1.0000 | ORAL_TABLET | ORAL | Status: AC | PRN

## 2011-08-28 MED ORDER — IBUPROFEN 800 MG PO TABS
800.0000 mg | ORAL_TABLET | Freq: Four times a day (QID) | ORAL | Status: AC | PRN
  Administered 2011-08-28: 800 mg via ORAL
  Filled 2011-08-28: qty 1

## 2011-08-28 MED ORDER — IBUPROFEN 800 MG PO TABS
800.0000 mg | ORAL_TABLET | Freq: Three times a day (TID) | ORAL | Status: AC | PRN
Start: 2011-08-28 — End: 2011-09-07

## 2011-08-28 NOTE — Progress Notes (Signed)
Melanie Oliver 01/29/1961 [id number]   1 Day Post-Op Procedure(s) (LRB): HYSTERECTOMY VAGINAL (N/A)  Subjective: Patient reports feels well, pain severity reported mild, yes taking PO, foley catheter in place, novoiding, yes ambulating, yespassing flatus  Objective: Afeb, VSS   EXAM General: awake, alert and cooperative Resp: rhonchi clear to auscultation bilaterally Cardio: regular rate and rhythm, S1, S2 normal, no murmur, click, rub or gallop GI: normal findings:soft, non-tender; bowel sounds normal; no masses,  no organomegaly Lower Extremities: Without swelling or tenderness Vaginal Bleeding: Reported scant  Assessment: s/p Procedure(s): HYSTERECTOMY VAGINAL: progressing well, ambulating, eating, flatus, ready for discharge after foley out and voiding.  Post op Hb 11.4  Plan: Discharge home today.  Precautions, instructions and follow up were discussed with the patient.  Prescriptions provided  Per AVS.  Patient to call the office to arrange a post-operative appointmant in 2 weeks.  Dara Lords, MD 08/28/2011 7:16 AM

## 2011-08-28 NOTE — Discharge Instructions (Signed)
°  Postoperative Instructions Hysterectomy ° °Dr. Stellan Vick and the nursing staff have discussed postoperative instructions with you.  If you have any questions please ask them before you leave the hospital, or call Dr Hamish Banks’s office at [phone].   ° °We would like to emphasize the following instructions: ° ° °  Call the office to make your follow-up appointment as recommended by Dr Rohit Deloria (usually 2 weeks). ° °  You were given a prescription, or one was ordered for you at the pharmacy you designated.  Get that prescription filled and take the medication according to instructions. ° °  You may eat a regular diet, but slowly until you start having bowel movements. ° °  Drink plenty of water daily. ° °  Nothing in the vagina (intercourse, douching, objects of any kind) until released by Dr Shavonn Convey. ° °  No driving for two weeks.  Wait to be cleared by Dr Dontae Minerva at your first post op check.  Car rides (short) are ok after several days at home, as long as you are not having significant pain, but no traveling out of town. ° °  You may shower, but no baths.  Walking up and down stairs is ok.  No heavy lifting, prolonged standing, repeated bending or any “working out” until your first post op check. ° °  Rest frequently, listen to your body and do not push yourself and overdo it. ° °  Call if: ° °o Your pain medication does not seem strong enough. °o Worsening pain or abdominal bloating °o Persistent nausea or vomiting °o Difficulty with urination or bowel movements. °o Temperature of 101 degrees or higher. °o Bleeding heavier then staining (clots or period type flow). °o Incisions become red, tender or begin to drain. °o You have any questions or concerns. °

## 2011-08-28 NOTE — Discharge Summary (Signed)
Melanie Oliver September 30, 1960 [id number]   Discharge Summary  Date of Admission:  08/27/2011  Date of Discharge:  08/28/2011  Discharge Diagnosis:  Uterine prolapse, leiomyoma  Procedure:  Procedure(s): HYSTERECTOMY VAGINAL  Pathology: Accession #: [id number] Diagnosis Uterus and cervix UTERINE CERVIX: - BENIGN TRANSFORMATION ZONE MUCOSA WITH CHRONIC CERVICITIS. - NO DYSPLASIA, ATYPIA OR MALIGNANCY IDENTIFIED. UTERINE CORPUS: ENDOMETRIUM: - PROLIFERATIVE PHASE ENDOMETRIUM. - NO HYPERPLASIA, ATYPIA OR MALIGNANCY IDENTIFIED. MYOMETRIUM: - LEIOMYOMATA (X2). - NO ATYPIA OR MALIGNANCY IDENTIFIED.   Hospital Course:  The patient underwent an uncomplicated TVH 08/27/2011.  Patient's postoperative course was uncomplicated she was discharged on postoperative day #1 ambulating well, tolerating a regular diet, voiding without difficulty with a postoperative hemoglobin of 11.4. The patient received precautions, instructions and follow up and will be seen in the office 2 weeks following discharge. She received prescriptions per AVS.    Dara Lords MD, 5:02 PM 08/28/2011

## 2011-08-31 ENCOUNTER — Ambulatory Visit (INDEPENDENT_AMBULATORY_CARE_PROVIDER_SITE_OTHER): Payer: BC Managed Care – PPO | Admitting: Gynecology

## 2011-08-31 ENCOUNTER — Encounter: Payer: Self-pay | Admitting: Gynecology

## 2011-08-31 VITALS — BP 130/90

## 2011-08-31 DIAGNOSIS — R609 Edema, unspecified: Secondary | ICD-10-CM

## 2011-08-31 NOTE — Progress Notes (Signed)
Patient is a 51 year old who 4 days ago underwent a transvaginal hysterectomy due to to uterine prolapse. Patient stayed in the hospital overnight and did well. The reason for visit today that she was concerned whether or not it was normal to feel some swelling in her lower extremities and her hands and arms. She was also concerned whether this could have been an allergic reaction to the Percocet or Motrin. She denied any shortness of breath or any chest pain or any back discomfort, fever chills or nausea or vomiting. Review of patient's  I/O's indicated she received approximately 2 L of IV fluids and had adequate urine output postop. She does have history of hypertension and is on lisinopril 10-12.5 mg daily. Patient denied any vaginal bleeding or abdominal discomfort.  Exam: Blood pressure 130/90 Lungs: Clear to auscultation Rogers or wheezes Heart: Regular rate and rhythm no murmurs or gallops Abdomen soft nontender no rebound or guarding Back: No CVA tenderness Extremities: Negative Homans sign trace edema  Assessment/plan: Patient was reassured that the minimal edema that she has experienced may be from fluid shift from her surgery and with ambulation this will begin to correct itself. She was reminded to take her lisinopril and to followup with Dr. Audie Box in 2 weeks for a postop visit. She stopped her Percocet and Motrin taking whether that could contribute to the above symptoms I reassured her that there is no association. She's is pleased. with her surgery and is doing well.

## 2011-09-07 ENCOUNTER — Ambulatory Visit (INDEPENDENT_AMBULATORY_CARE_PROVIDER_SITE_OTHER): Payer: BC Managed Care – PPO | Admitting: Gynecology

## 2011-09-07 ENCOUNTER — Encounter: Payer: Self-pay | Admitting: Gynecology

## 2011-09-07 DIAGNOSIS — Z9889 Other specified postprocedural states: Secondary | ICD-10-CM

## 2011-09-07 DIAGNOSIS — R109 Unspecified abdominal pain: Secondary | ICD-10-CM

## 2011-09-07 LAB — URINALYSIS W MICROSCOPIC + REFLEX CULTURE
Casts: NONE SEEN
Crystals: NONE SEEN
Nitrite: NEGATIVE
Specific Gravity, Urine: 1.02 (ref 1.005–1.030)
Urobilinogen, UA: 0.2 mg/dL (ref 0.0–1.0)
pH: 6 (ref 5.0–8.0)

## 2011-09-07 NOTE — Progress Notes (Signed)
Patient presents for two-week postoperative check status post TVH doing well. She does note some bladder pressure symptoms when her bladder is full but no frequency, dysuria, fever, chills, low back pain. Was seen a week after surgery due to fluid retention and this has all resolved.  Exam with Sherrilyn Rist Asst. Abdomen soft nontender without masses guarding rebound organomegaly. Pelvic external BUS vagina normal with cuff healing nicely. Bimanual without masses or tenderness.  Assessment and plan: Normal postop check status post TVH. Doing well. Urinalysis contaminated. Will await culture and treat as indicated. Patient will slowly resume activities, maintain pelvic rest and follow up in 2 weeks. Pathology reviewed: Accession #: [id number] FINAL DIAGNOSIS Diagnosis Uterus and cervix UTERINE CERVIX: - BENIGN TRANSFORMATION ZONE MUCOSA WITH CHRONIC CERVICITIS. - NO DYSPLASIA, ATYPIA OR MALIGNANCY IDENTIFIED. UTERINE CORPUS: ENDOMETRIUM: - PROLIFERATIVE PHASE ENDOMETRIUM. - NO HYPERPLASIA, ATYPIA OR MALIGNANCY IDENTIFIED. MYOMETRIUM: - LEIOMYOMATA (X2). - NO ATYPIA OR MALIGNANCY IDENTIFIED.

## 2011-09-07 NOTE — Patient Instructions (Signed)
Followup in 2 weeks for your next postoperative checkup. 

## 2011-09-09 LAB — URINE CULTURE: Colony Count: 75000

## 2011-09-25 ENCOUNTER — Ambulatory Visit (INDEPENDENT_AMBULATORY_CARE_PROVIDER_SITE_OTHER): Payer: BC Managed Care – PPO | Admitting: Gynecology

## 2011-09-25 ENCOUNTER — Encounter: Payer: Self-pay | Admitting: Gynecology

## 2011-09-25 DIAGNOSIS — R42 Dizziness and giddiness: Secondary | ICD-10-CM

## 2011-09-25 DIAGNOSIS — Z9889 Other specified postprocedural states: Secondary | ICD-10-CM

## 2011-09-25 MED ORDER — SCOPOLAMINE 1 MG/3DAYS TD PT72: 1.0000 | MEDICATED_PATCH | TRANSDERMAL | Status: AC

## 2011-09-25 NOTE — Progress Notes (Signed)
4 weeks postop status post TVH doing well.  Exam was Sherrilyn Rist assistant Pelvic: External BUS vagina with cuff healing nicely. Bimanual without masses or tenderness.  Assessment and plan: 1 month postop status post TVH doing well. We'll slowly resume all activities with the exception of pelvic rest for another 2 weeks. He can return to work next week initially half days' time several and then full-time work. Assuming she continues well she'll see me in April 2014 when she is due for her annual exam.  She does have an issue with intermittent vertigo and notes that the scopolamine patch seems to really help. She asked if I could prescribe this for her. I prescribed the scopolamine patch to kits with one refill. Instructions as far as hand washing avoiding eye contact reviewed. Side effect profile and risks also reviewed.

## 2011-09-25 NOTE — Patient Instructions (Signed)
Slowly resume all normal activities. Continue pelvic rest for another 2 weeks. Follow up April 2014 for your annual exam.

## 2011-11-30 ENCOUNTER — Encounter: Payer: Self-pay | Admitting: Gynecology

## 2011-11-30 NOTE — Progress Notes (Signed)
Disability form completed and faxed back to patient at her request.  Copy is in e-chart.

## 2013-10-15 NOTE — H&P (Signed)
  NTS SOAP Note  Vital Signs:  Vitals as of: 0/96/2836: Systolic 629: Diastolic 98: Heart Rate 73: Temp 97.47F: Height 63ft 1in: Weight 215Lbs 0 Ounces: BMI 40.62  BMI : 40.62 kg/m2  Subjective: This 53 Years 53 Months old Female presents for a colonoscopy.  Never has had one.  No known immediate family h/o colon cancer.  Did have one episode of blood per rectum four weeeks ago.  Does have intermittent bowel urgency.  Review of Symptoms:  Constitutional:unremarkable   Head:unremarkable    Eyes:unremarkable   sinus Cardiovascular:  unremarkable   Respiratory:unremarkable   Gastrointestin    heartburn Genitourinary:unremarkable     Musculoskeletal:unremarkable   Skin:unremarkable Hematolgic/Lymphatic:unremarkable     Allergic/Immunologic:unremarkable     Past Medical History:    Reviewed  Past Medical History  Surgical History: TAH Medical Problems: HTN Allergies: nkda Medications: lisinopril   Social History:Reviewed  Social History  Preferred Language: English Race:  White Ethnicity: Not Hispanic / Latino Age: 53 Years 10 Months Marital Status:  M Alcohol: no   Smoking Status: Never smoker reviewed on 10/15/2013 Functional Status reviewed on 10/15/2013 ------------------------------------------------ Bathing: Normal Cooking: Normal Dressing: Normal Driving: Normal Eating: Normal Managing Meds: Normal Oral Care: Normal Shopping: Normal Toileting: Normal Transferring: Normal Walking: Normal Cognitive Status reviewed on 10/15/2013 ------------------------------------------------ Attention: Normal Decision Making: Normal Language: Normal Memory: Normal Motor: Normal Perception: Normal Problem Solving: Normal Visual and Spatial: Normal   Family History:  Reviewed  Family Health History Mother, Deceased; Cancer unspecified;  Father, Deceased; Lymphoma;     Objective Information: General:  Well  appearing, well nourished in no distress. Neck:  Supple without lymphadenopathy.  Heart:  RRR, no murmur or gallop.  Normal S1, S2.  No S3, S4.  Lungs:    CTA bilaterally, no wheezes, rhonchi, rales.  Breathing unlabored. Abdomen:Soft, NT/ND, no HSM, no masses.   deferred to procedure  Assessment:Need for screening TCS  Diagnoses: 569.3 Fresh blood passed per rectum (Hemorrhage of anus and rectum)  Procedures: 47654 - OFFICE OUTPATIENT NEW 20 MINUTES    Plan:  Scheduled for TCS on 10/27/13.   Patient Education:Alternative treatments to surgery were discussed with patient (and family).  Risks and benefits  of procedure including bleeding and perforation were fully explained to the patient (and family) who gave informed consent. Patient/family questions were addressed.  Trilyte prescribed.  Follow-up:Pending Surgery

## 2013-10-27 ENCOUNTER — Encounter (HOSPITAL_COMMUNITY): Payer: Self-pay | Admitting: *Deleted

## 2013-10-27 ENCOUNTER — Ambulatory Visit (HOSPITAL_COMMUNITY)
Admission: RE | Admit: 2013-10-27 | Discharge: 2013-10-27 | Disposition: A | Discharge status: Home / Self Care | Payer: BC Managed Care – PPO | Source: Ambulatory Visit | Attending: General Surgery | Admitting: General Surgery

## 2013-10-27 ENCOUNTER — Encounter (HOSPITAL_COMMUNITY)
Admission: RE | Disposition: A | Discharge status: Home / Self Care | Payer: Self-pay | Source: Ambulatory Visit | Attending: General Surgery

## 2013-10-27 DIAGNOSIS — Z1211 Encounter for screening for malignant neoplasm of colon: Secondary | ICD-10-CM | POA: Diagnosis not present

## 2013-10-27 DIAGNOSIS — K625 Hemorrhage of anus and rectum: Secondary | ICD-10-CM | POA: Insufficient documentation

## 2013-10-27 HISTORY — PX: COLONOSCOPY: SHX5424

## 2013-10-27 SURGERY — COLONOSCOPY
Anesthesia: Moderate Sedation

## 2013-10-27 MED ORDER — SODIUM CHLORIDE 0.9 % IV SOLN
INTRAVENOUS | Status: DC
  Administered 2013-10-27: 08:00:00 via INTRAVENOUS

## 2013-10-27 MED ORDER — MIDAZOLAM HCL 5 MG/5ML IJ SOLN
INTRAMUSCULAR | Status: DC | PRN
  Administered 2013-10-27: 3 mg via INTRAVENOUS

## 2013-10-27 MED ORDER — SIMETHICONE 40 MG/0.6ML PO SUSP
ORAL | Status: DC | PRN
  Administered 2013-10-27: 08:00:00

## 2013-10-27 MED ORDER — MEPERIDINE HCL 50 MG/ML IJ SOLN
INTRAMUSCULAR | Status: DC | PRN
  Administered 2013-10-27: 50 mg via INTRAVENOUS

## 2013-10-27 MED ORDER — MEPERIDINE HCL 50 MG/ML IJ SOLN
INTRAMUSCULAR | Status: DC
Start: 2013-10-27 — End: 2013-10-27
  Filled 2013-10-27: qty 1

## 2013-10-27 MED ORDER — MIDAZOLAM HCL 5 MG/5ML IJ SOLN
INTRAMUSCULAR | Status: AC
  Filled 2013-10-27: qty 5

## 2013-10-27 NOTE — Op Note (Signed)
[facility] [address] [place], [address]   COLONOSCOPY PROCEDURE REPORT  PATIENT: Melanie Oliver, Melanie Oliver  MR#: [id number] BIRTHDATE: 10-02-1960 , 52  yrs. old GENDER: Female ENDOSCOPIST: Aviva Signs, MD REFERRED NO:[id number], John PROCEDURE DATE:  10/27/2013 PROCEDURE:   Colonoscopy, screening ASA CLASS:   Class II INDICATIONS:Average risk patient for colon cancer. MEDICATIONS: Versed 3 mg IV and Demerol 50 mg IV  DESCRIPTION OF PROCEDURE:   After the risks benefits and alternatives of the procedure were thoroughly explained, informed consent was obtained.  A digital rectal exam revealed internal hemorrhoids.   The EC-[id number] ([id number])  endoscope was introduced through the anus and advanced to the cecum, which was identified by both the appendix and ileocecal valve. No adverse events experienced.   The quality of the prep was adequate, using Trilyte The instrument was then slowly withdrawn as the colon was fully examined.      COLON FINDINGS: A normal appearing cecum, ileocecal valve, and appendiceal orifice were identified.  The ascending, hepatic flexure, transverse, splenic flexure, descending, sigmoid colon and rectum appeared unremarkable.  No polyps or cancers were seen. Retroflexion was not performed due to a narrow rectal vault. The time to cecum=2 minutes 0 seconds.  Withdrawal time=5 minutes 0 seconds.  The scope was withdrawn and the procedure completed. COMPLICATIONS: There were no complications.  ENDOSCOPIC IMPRESSION: Normal colon  RECOMMENDATIONS: Repeat Colonscopy in 10 years.   eSigned:  Aviva Signs, MD 10/27/2013 8:41 AM   cc:

## 2013-10-27 NOTE — Interval H&P Note (Signed)
History and Physical Interval Note:  10/27/2013 8:25 AM  Melanie Oliver  has presented today for surgery, with the diagnosis of screening  The various methods of treatment have been discussed with the patient and family. After consideration of risks, benefits and other options for treatment, the patient has consented to  Procedure(s): COLONOSCOPY (N/A) as a surgical intervention .  The patient's history has been reviewed, patient examined, no change in status, stable for surgery.  I have reviewed the patient's chart and labs.  Questions were answered to the patient's satisfaction.     Aviva Signs A

## 2013-10-27 NOTE — Discharge Instructions (Signed)
Hemorrhoids °Hemorrhoids are swollen veins around the rectum or anus. There are two types of hemorrhoids:  °· Internal hemorrhoids. These occur in the veins just inside the rectum. They may poke through to the outside and become irritated and painful. °· External hemorrhoids. These occur in the veins outside the anus and can be felt as a painful swelling or hard lump near the anus. °CAUSES °· Pregnancy.   °· Obesity.   °· Constipation or diarrhea.   °· Straining to have a bowel movement.   °· Sitting for long periods on the toilet. °· Heavy lifting or other activity that caused you to strain. °· Anal intercourse. °SYMPTOMS  °· Pain.   °· Anal itching or irritation.   °· Rectal bleeding.   °· Fecal leakage.   °· Anal swelling.   °· One or more lumps around the anus.   °DIAGNOSIS  °Your caregiver may be able to diagnose hemorrhoids by visual examination. Other examinations or tests that may be performed include:  °· Examination of the rectal area with a gloved hand (digital rectal exam).   °· Examination of anal canal using a small tube (scope).   °· A blood test if you have lost a significant amount of blood. °· A test to look inside the colon (sigmoidoscopy or colonoscopy). °TREATMENT °Most hemorrhoids can be treated at home. However, if symptoms do not seem to be getting better or if you have a lot of rectal bleeding, your caregiver may perform a procedure to help make the hemorrhoids get smaller or remove them completely. Possible treatments include:  °· Placing a rubber band at the base of the hemorrhoid to cut off the circulation (rubber band ligation).   °· Injecting a chemical to shrink the hemorrhoid (sclerotherapy).   °· Using a tool to burn the hemorrhoid (infrared light therapy).   °· Surgically removing the hemorrhoid (hemorrhoidectomy).   °· Stapling the hemorrhoid to block blood flow to the tissue (hemorrhoid stapling).   °HOME CARE INSTRUCTIONS  °· Eat foods with fiber, such as whole grains, beans,  nuts, fruits, and vegetables. Ask your doctor about taking products with added fiber in them (fiber supplements). °· Increase fluid intake. Drink enough water and fluids to keep your urine clear or pale yellow.   °· Exercise regularly.   °· Go to the bathroom when you have the urge to have a bowel movement. Do not wait.   °· Avoid straining to have bowel movements.   °· Keep the anal area dry and clean. Use wet toilet paper or moist towelettes after a bowel movement.   °· Medicated creams and suppositories may be used or applied as directed.   °· Only take over-the-counter or prescription medicines as directed by your caregiver.   °· Take warm sitz baths for 15-20 minutes, 3-4 times a day to ease pain and discomfort.   °· Place ice packs on the hemorrhoids if they are tender and swollen. Using ice packs between sitz baths may be helpful.   °¨ Put ice in a plastic bag.   °¨ Place a towel between your skin and the bag.   °¨ Leave the ice on for 15-20 minutes, 3-4 times a day.   °· Do not use a donut-shaped pillow or sit on the toilet for long periods. This increases blood pooling and pain.   °SEEK MEDICAL CARE IF: °· You have increasing pain and swelling that is not controlled by treatment or medicine. °· You have uncontrolled bleeding. °· You have difficulty or you are unable to have a bowel movement. °· You have pain or inflammation outside the area of the hemorrhoids. °MAKE SURE YOU: °· Understand these instructions. °·   Will watch your condition.  Will get help right away if you are not doing well or get worse. Document Released: 02/10/2000 Document Revised: 01/30/2012 Document Reviewed: 12/18/2011 [facility] Patient Information 2015 [place], [place]. This information is not intended to replace advice given to you by your health care provider. Make sure you discuss any questions you have with your health care provider. Colonoscopy, Care After These instructions give you information on caring for yourself after  your procedure. Your doctor may also give you more specific instructions. Call your doctor if you have any problems or questions after your procedure. HOME CARE  Do not drive for 24 hours.  Do not sign important papers or use machinery for 24 hours.  You may shower.  You may go back to your usual activities, but go slower for the first 24 hours.  Take rest breaks often during the first 24 hours.  Walk around or use warm packs on your belly (abdomen) if you have belly cramping or gas.  Drink enough fluids to keep your pee (urine) clear or pale yellow.  Resume your normal diet. Avoid heavy or fried foods.  Avoid drinking alcohol for 24 hours or as told by your doctor.  Only take medicines as told by your doctor. If a tissue sample (biopsy) was taken during the procedure:   Do not take aspirin or blood thinners for 7 days, or as told by your doctor.  Do not drink alcohol for 7 days, or as told by your doctor.  Eat soft foods for the first 24 hours. GET HELP IF: You still have a small amount of blood in your poop (stool) 2-3 days after the procedure. GET HELP RIGHT AWAY IF:  You have more than a small amount of blood in your poop.  You see clumps of tissue (blood clots) in your poop.  Your belly is puffy (swollen).  You feel sick to your stomach (nauseous) or throw up (vomit).  You have a fever.  You have belly pain that gets worse and medicine does not help. MAKE SURE YOU:  Understand these instructions.  Will watch your condition.  Will get help right away if you are not doing well or get worse. Document Released: 03/17/2010 Document Revised: 02/17/2013 Document Reviewed: 10/20/2012 [facility] Patient Information 2015 [place], [place]. This information is not intended to replace advice given to you by your health care provider. Make sure you discuss any questions you have with your health care provider.

## 2013-10-29 ENCOUNTER — Encounter (HOSPITAL_COMMUNITY): Payer: Self-pay | Admitting: General Surgery

## 2013-12-18 ENCOUNTER — Ambulatory Visit (HOSPITAL_COMMUNITY)
Admission: RE | Admit: 2013-12-18 | Discharge: 2013-12-18 | Disposition: A | Discharge status: Home / Self Care | Payer: BC Managed Care – PPO | Source: Ambulatory Visit | Attending: Family Medicine | Admitting: Family Medicine

## 2013-12-18 ENCOUNTER — Encounter (INDEPENDENT_AMBULATORY_CARE_PROVIDER_SITE_OTHER): Payer: Self-pay

## 2013-12-18 ENCOUNTER — Other Ambulatory Visit (HOSPITAL_COMMUNITY): Payer: Self-pay | Admitting: Family Medicine

## 2013-12-18 DIAGNOSIS — M25561 Pain in right knee: Secondary | ICD-10-CM | POA: Insufficient documentation

## 2013-12-28 ENCOUNTER — Encounter (HOSPITAL_COMMUNITY): Payer: Self-pay | Admitting: General Surgery

## 2013-12-29 ENCOUNTER — Other Ambulatory Visit (HOSPITAL_COMMUNITY)
Admission: RE | Admit: 2013-12-29 | Discharge: 2013-12-29 | Disposition: A | Discharge status: Home / Self Care | Payer: BC Managed Care – PPO | Source: Ambulatory Visit | Attending: Gynecology | Admitting: Gynecology

## 2013-12-29 ENCOUNTER — Ambulatory Visit (INDEPENDENT_AMBULATORY_CARE_PROVIDER_SITE_OTHER): Payer: BC Managed Care – PPO | Admitting: Gynecology

## 2013-12-29 ENCOUNTER — Encounter: Payer: Self-pay | Admitting: Gynecology

## 2013-12-29 VITALS — BP 124/80 | Ht 60.0 in | Wt 216.0 lb

## 2013-12-29 DIAGNOSIS — Z01419 Encounter for gynecological examination (general) (routine) without abnormal findings: Secondary | ICD-10-CM | POA: Diagnosis present

## 2013-12-29 DIAGNOSIS — N951 Menopausal and female climacteric states: Secondary | ICD-10-CM

## 2013-12-29 NOTE — Patient Instructions (Signed)
Call to Schedule your mammogram  Facilities in [place]: 1)  [facility], [address] Phone: [phone] 2)  [facility]. [address] [place] Phone: [phone] 3)  Dr. Isaiah Blakes at [facility] [address] Phone: [phone]     Mammogram A mammogram is an X-ray test to find changes in a woman's breast. You should get a mammogram if:  You are 53 years of age or older  You have risk factors.   Your doctor recommends that you have one.  BEFORE THE TEST  Do not schedule the test the week before your period, especially if your breasts are sore during this time.  On the day of your mammogram:  Wash your breasts and armpits well. After washing, do not put on any deodorant or talcum powder on until after your test.   Eat and drink as you usually do.   Take your medicines as usual.   If you are diabetic and take insulin, make sure you:   Eat before coming for your test.   Take your insulin as usual.   If you cannot keep your appointment, call before the appointment to cancel. Schedule another appointment.  TEST  You will need to undress from the waist up. You will put on a hospital gown.   Your breast will be put on the mammogram machine, and it will press firmly on your breast with a piece of plastic called a compression paddle. This will make your breast flatter so that the machine can X-ray all parts of your breast.   Both breasts will be X-rayed. Each breast will be X-rayed from above and from the side. An X-ray might need to be taken again if the picture is not good enough.   The mammogram will last about 15 to 30 minutes.  AFTER THE TEST Finding out the results of your test Ask when your test results will be ready. Make sure you get your test results.  Document Released: 05/11/2008 Document Revised: 02/01/2011 Document Reviewed: 05/11/2008 [facility] Patient  Information 2012 [place].  You may obtain a copy of any labs that were done today by logging onto MyChart as outlined in the instructions provided with your AVS (after visit summary). The office will not call with normal lab results but certainly if there are any significant abnormalities then we will contact you.   Health Maintenance, Female A healthy lifestyle and preventative care can promote health and wellness.  Maintain regular health, dental, and eye exams.  Eat a healthy diet. Foods like vegetables, fruits, whole grains, low-fat dairy products, and lean protein foods contain the nutrients you need without too many calories. Decrease your intake of foods high in solid fats, added sugars, and salt. Get information about a proper diet from your caregiver, if necessary.  Regular physical exercise is one of the most important things you can do for your health. Most adults should get at least 150 minutes of moderate-intensity exercise (any activity that increases your heart rate and causes you to sweat) each week. In addition, most adults need muscle-strengthening exercises on 2 or more days a week.   Maintain a healthy weight. The body mass index (BMI) is a screening tool to identify possible weight problems. It provides an estimate of body fat based on height and weight. Your caregiver can help determine your BMI, and can help you achieve or maintain a healthy weight. For adults  20 years and older:  A BMI below 18.5 is considered underweight.  A BMI of 18.5 to 24.9 is normal.  A BMI of 25 to 29.9 is considered overweight.  A BMI of 30 and above is considered obese.  Maintain normal blood lipids and cholesterol by exercising and minimizing your intake of saturated fat. Eat a balanced diet with plenty of fruits and vegetables. Blood tests for lipids and cholesterol should begin at age 20 and be repeated every 5 years. If your lipid or cholesterol levels are high, you are over 50, or  you are a high risk for heart disease, you may need your cholesterol levels checked more frequently.Ongoing high lipid and cholesterol levels should be treated with medicines if diet and exercise are not effective.  If you smoke, find out from your caregiver how to quit. If you do not use tobacco, do not start.  Lung cancer screening is recommended for adults aged 55 80 years who are at high risk for developing lung cancer because of a history of smoking. Yearly low-dose computed tomography (CT) is recommended for people who have at least a 30-pack-year history of smoking and are a current smoker or have quit within the past 15 years. A pack year of smoking is smoking an average of 1 pack of cigarettes a day for 1 year (for example: 1 pack a day for 30 years or 2 packs a day for 15 years). Yearly screening should continue until the smoker has stopped smoking for at least 15 years. Yearly screening should also be stopped for people who develop a health problem that would prevent them from having lung cancer treatment.  If you are pregnant, do not drink alcohol. If you are breastfeeding, be very cautious about drinking alcohol. If you are not pregnant and choose to drink alcohol, do not exceed 1 drink per day. One drink is considered to be 12 ounces (355 mL) of beer, 5 ounces (148 mL) of wine, or 1.5 ounces (44 mL) of liquor.  Avoid use of street drugs. Do not share needles with anyone. Ask for help if you need support or instructions about stopping the use of drugs.  High blood pressure causes heart disease and increases the risk of stroke. Blood pressure should be checked at least every 1 to 2 years. Ongoing high blood pressure should be treated with medicines, if weight loss and exercise are not effective.  If you are 55 to 53 years old, ask your caregiver if you should take aspirin to prevent strokes.  Diabetes screening involves taking a blood sample to check your fasting blood sugar level. This  should be done once every 3 years, after age 45, if you are within normal weight and without risk factors for diabetes. Testing should be considered at a younger age or be carried out more frequently if you are overweight and have at least 1 risk factor for diabetes.  Breast cancer screening is essential preventative care for women. You should practice "breast self-awareness." This means understanding the normal appearance and feel of your breasts and may include breast self-examination. Any changes detected, no matter how small, should be reported to a caregiver. Women in their 20s and 30s should have a clinical breast exam (CBE) by a caregiver as part of a regular health exam every 1 to 3 years. After age 40, women should have a CBE every year. Starting at age 40, women should consider having a mammogram (breast X-ray) every year. Women who have a   family history of breast cancer should talk to their caregiver about genetic screening. Women at a high risk of breast cancer should talk to their caregiver about having an MRI and a mammogram every year.  Breast cancer gene (BRCA)-related cancer risk assessment is recommended for women who have family members with BRCA-related cancers. BRCA-related cancers include breast, ovarian, tubal, and peritoneal cancers. Having family members with these cancers may be associated with an increased risk for harmful changes (mutations) in the breast cancer genes BRCA1 and BRCA2. Results of the assessment will determine the need for genetic counseling and BRCA1 and BRCA2 testing.  The Pap test is a screening test for cervical cancer. Women should have a Pap test starting at age 33. Between ages 75 and 14, Pap tests should be repeated every 2 years. Beginning at age 4, you should have a Pap test every 3 years as long as the past 3 Pap tests have been normal. If you had a hysterectomy for a problem that was not cancer or a condition that could lead to cancer, then you no longer  need Pap tests. If you are between ages 72 and 12, and you have had normal Pap tests going back 10 years, you no longer need Pap tests. If you have had past treatment for cervical cancer or a condition that could lead to cancer, you need Pap tests and screening for cancer for at least 20 years after your treatment. If Pap tests have been discontinued, risk factors (such as a new sexual partner) need to be reassessed to determine if screening should be resumed. Some women have medical problems that increase the chance of getting cervical cancer. In these cases, your caregiver may recommend more frequent screening and Pap tests.  The human papillomavirus (HPV) test is an additional test that may be used for cervical cancer screening. The HPV test looks for the virus that can cause the cell changes on the cervix. The cells collected during the Pap test can be tested for HPV. The HPV test could be used to screen women aged 84 years and older, and should be used in women of any age who have unclear Pap test results. After the age of 73, women should have HPV testing at the same frequency as a Pap test.  Colorectal cancer can be detected and often prevented. Most routine colorectal cancer screening begins at the age of 56 and continues through age 76. However, your caregiver may recommend screening at an earlier age if you have risk factors for colon cancer. On a yearly basis, your caregiver may provide home test kits to check for hidden blood in the stool. Use of a small camera at the end of a tube, to directly examine the colon (sigmoidoscopy or colonoscopy), can detect the earliest forms of colorectal cancer. Talk to your caregiver about this at age 20, when routine screening begins. Direct examination of the colon should be repeated every 5 to 10 years through age 83, unless early forms of pre-cancerous polyps or small growths are found.  Hepatitis C blood testing is recommended for all people born from [birth year]  through 1965 and any individual with known risks for hepatitis C.  Practice safe sex. Use condoms and avoid high-risk sexual practices to reduce the spread of sexually transmitted infections (STIs). Sexually active women aged 28 and younger should be checked for Chlamydia, which is a common sexually transmitted infection. Older women with new or multiple partners should also be tested for Chlamydia. Testing for  other STIs is recommended if you are sexually active and at increased risk.  Osteoporosis is a disease in which the bones lose minerals and strength with aging. This can result in serious bone fractures. The risk of osteoporosis can be identified using a bone density scan. Women ages 35 and over and women at risk for fractures or osteoporosis should discuss screening with their caregivers. Ask your caregiver whether you should be taking a calcium supplement or vitamin D to reduce the rate of osteoporosis.  Menopause can be associated with physical symptoms and risks. Hormone replacement therapy is available to decrease symptoms and risks. You should talk to your caregiver about whether hormone replacement therapy is right for you.  Use sunscreen. Apply sunscreen liberally and repeatedly throughout the day. You should seek shade when your shadow is shorter than you. Protect yourself by wearing long sleeves, pants, a wide-brimmed hat, and sunglasses year round, whenever you are outdoors.  Notify your caregiver of new moles or changes in moles, especially if there is a change in shape or color. Also notify your caregiver if a mole is larger than the size of a pencil eraser.  Stay current with your immunizations. Document Released: 08/28/2010 Document Revised: 06/09/2012 Document Reviewed: 08/28/2010 [facility] Patient Information 2014 [place].

## 2013-12-29 NOTE — Progress Notes (Signed)
MAI LONGNECKER November 28, 1960 [id number]        53 y.o.  G1P1 for annual exam.  Has not been in for 2 years. Doing well overall. Several issues noted below.  Past medical history,surgical history, problem list, medications, allergies, family history and social history were all reviewed and documented as reviewed in the EPIC chart.  ROS:  12 system ROS performed with pertinent positives and negatives included in the history, assessment and plan.   Additional significant findings :  none   Exam: Kim Counsellor Vitals:   12/29/13 1511  BP: 124/80  Height: 5' (1.524 m)  Weight: 216 lb (97.977 kg)   General appearance:  Normal affect, orientation and appearance. Skin: Grossly normal HEENT: Without gross lesions.  No cervical or supraclavicular adenopathy. Thyroid normal.  Lungs:  Clear without wheezing, rales or rhonchi Cardiac: RR, without RMG Abdominal:  Soft, nontender, without masses, guarding, rebound, organomegaly or hernia Breasts:  Examined lying and sitting without masses, retractions, discharge or axillary adenopathy. Pelvic:  Ext/BUS/vagina normal. Pap of cuff done  Adnexa  Without masses or tenderness    Anus and perineum  Normal   Rectovaginal  Normal sphincter tone without palpated masses or tenderness.    Assessment/Plan:  53 y.o. G1P1 female for annual exam .   1. Menopausal symptoms. Patient is noting some hot flushes and night sweats. Note vaginal dryness or dyspareunia. Status post TVH for prolapse 2013. Options for management to include OTC products, HRT and nonhormonal pharmacologic such as Effexor were reviewed.  I reviewed the whole issue of HRT with her to include the WHI study with increased risk of stroke, heart attack, DVT and breast cancer. The ACOG and NAMS statements for lowest dose for the shortest period of time reviewed. Transdermal versus oral first-pass effect benefit discussed.  At this point patient is not interested in prescription medication.  Would prefer to monitor for now. Will follow up if worsens or wants to rediscuss treatment options. 2. Pap 2012. Pap smear of vaginal cuff today. No history of significant abnormal Pap smears previously. Options to stop screening altogether she is status post hysterectomy for benign indications versus screening on a less frequent interval reviewed. Will readdress on an annual basis. 3. Mammography 2007.patient knows she is way overdue. She agrees to schedule. Names and numbers provided. SBE monthly reviewed. 4. Colonoscopy 2015. Repeat at their recommended interval. 5. DEXA never. Will plan further into the menopause. Increase calcium vitamin D recommendations reviewed. 6. Health maintenance. No routine blood work done and she reports this done at her primary physician's office. Recently treated for UTI. Will check urinalysis today. Followup in one year, sooner as needed.     Anastasio Auerbach MD, 3:29 PM 12/29/2013

## 2013-12-29 NOTE — Addendum Note (Signed)
Addended by: Nelva Nay on: 12/29/2013 03:47 PM   Modules accepted: Orders, SmartSet

## 2013-12-30 LAB — URINALYSIS W MICROSCOPIC + REFLEX CULTURE
BILIRUBIN URINE: NEGATIVE
Bacteria, UA: NONE SEEN
Casts: NONE SEEN
Crystals: NONE SEEN
Glucose, UA: NEGATIVE mg/dL
Hgb urine dipstick: NEGATIVE
Ketones, ur: NEGATIVE mg/dL
Leukocytes, UA: NEGATIVE
Nitrite: NEGATIVE
PROTEIN: NEGATIVE mg/dL
Specific Gravity, Urine: 1.005 (ref 1.005–1.030)
Squamous Epithelial / LPF: NONE SEEN
UROBILINOGEN UA: 0.2 mg/dL (ref 0.0–1.0)
pH: 6 (ref 5.0–8.0)

## 2013-12-31 LAB — CYTOLOGY - PAP

## 2014-04-21 ENCOUNTER — Ambulatory Visit (HOSPITAL_COMMUNITY)
Admission: RE | Admit: 2014-04-21 | Discharge: 2014-04-21 | Disposition: A | Discharge status: Home / Self Care | Payer: BC Managed Care – PPO | Source: Ambulatory Visit | Attending: Family Medicine | Admitting: Family Medicine

## 2014-04-21 ENCOUNTER — Other Ambulatory Visit (HOSPITAL_COMMUNITY): Payer: Self-pay | Admitting: Family Medicine

## 2014-04-21 DIAGNOSIS — M545 Low back pain: Secondary | ICD-10-CM | POA: Diagnosis not present

## 2015-06-29 ENCOUNTER — Ambulatory Visit (HOSPITAL_COMMUNITY)
Admission: RE | Admit: 2015-06-29 | Discharge: 2015-06-29 | Disposition: A | Discharge status: Home / Self Care | Payer: BC Managed Care – PPO | Source: Ambulatory Visit | Attending: Physician Assistant | Admitting: Physician Assistant

## 2015-06-29 ENCOUNTER — Other Ambulatory Visit (HOSPITAL_COMMUNITY): Payer: Self-pay | Admitting: Physician Assistant

## 2015-06-29 DIAGNOSIS — M79645 Pain in left finger(s): Secondary | ICD-10-CM

## 2015-06-29 DIAGNOSIS — X58XXXA Exposure to other specified factors, initial encounter: Secondary | ICD-10-CM | POA: Insufficient documentation

## 2015-06-29 DIAGNOSIS — Z1389 Encounter for screening for other disorder: Secondary | ICD-10-CM | POA: Insufficient documentation

## 2015-06-29 DIAGNOSIS — Z6834 Body mass index (BMI) 34.0-34.9, adult: Secondary | ICD-10-CM | POA: Insufficient documentation

## 2015-06-29 DIAGNOSIS — S60011A Contusion of right thumb without damage to nail, initial encounter: Secondary | ICD-10-CM | POA: Insufficient documentation

## 2015-12-23 ENCOUNTER — Ambulatory Visit (HOSPITAL_COMMUNITY)
Admission: RE | Admit: 2015-12-23 | Discharge: 2015-12-23 | Disposition: A | Discharge status: Home / Self Care | Payer: BC Managed Care – PPO | Source: Ambulatory Visit | Attending: Family Medicine | Admitting: Family Medicine

## 2015-12-23 ENCOUNTER — Other Ambulatory Visit (HOSPITAL_COMMUNITY): Payer: Self-pay | Admitting: Family Medicine

## 2015-12-23 DIAGNOSIS — M25522 Pain in left elbow: Secondary | ICD-10-CM

## 2015-12-23 DIAGNOSIS — X58XXXA Exposure to other specified factors, initial encounter: Secondary | ICD-10-CM | POA: Diagnosis not present

## 2015-12-23 DIAGNOSIS — S52125A Nondisplaced fracture of head of left radius, initial encounter for closed fracture: Secondary | ICD-10-CM | POA: Diagnosis not present

## 2015-12-26 ENCOUNTER — Telehealth: Payer: Self-pay | Admitting: Orthopaedic Surgery

## 2015-12-26 NOTE — Telephone Encounter (Signed)
We received a stat referral from [facility] on Friday afternoon after our office had closed. I called the patient's home this morning but did not get an answer.  Later I called the patient at work and got to speak with her.  She stated that she has an appointment in [place] with a Dr. Layne Benton at 1:00.

## 2017-05-14 ENCOUNTER — Other Ambulatory Visit (HOSPITAL_COMMUNITY): Payer: Self-pay | Admitting: Family Medicine

## 2017-05-14 DIAGNOSIS — Z1231 Encounter for screening mammogram for malignant neoplasm of breast: Secondary | ICD-10-CM

## 2017-05-20 ENCOUNTER — Ambulatory Visit (HOSPITAL_COMMUNITY): Payer: BC Managed Care – PPO

## 2018-12-04 ENCOUNTER — Emergency Department (HOSPITAL_COMMUNITY): Payer: BC Managed Care – PPO

## 2018-12-04 ENCOUNTER — Other Ambulatory Visit: Payer: Self-pay

## 2018-12-04 ENCOUNTER — Encounter (HOSPITAL_COMMUNITY): Payer: Self-pay

## 2018-12-04 ENCOUNTER — Emergency Department (HOSPITAL_COMMUNITY)
Admission: EM | Admit: 2018-12-04 | Discharge: 2018-12-04 | Disposition: A | Discharge status: Home / Self Care | Payer: BC Managed Care – PPO | Attending: Emergency Medicine | Admitting: Emergency Medicine

## 2018-12-04 DIAGNOSIS — R0789 Other chest pain: Secondary | ICD-10-CM | POA: Insufficient documentation

## 2018-12-04 DIAGNOSIS — Y9389 Activity, other specified: Secondary | ICD-10-CM | POA: Insufficient documentation

## 2018-12-04 DIAGNOSIS — Y999 Unspecified external cause status: Secondary | ICD-10-CM | POA: Insufficient documentation

## 2018-12-04 DIAGNOSIS — I10 Essential (primary) hypertension: Secondary | ICD-10-CM | POA: Insufficient documentation

## 2018-12-04 DIAGNOSIS — M25511 Pain in right shoulder: Secondary | ICD-10-CM | POA: Insufficient documentation

## 2018-12-04 DIAGNOSIS — Y92410 Unspecified street and highway as the place of occurrence of the external cause: Secondary | ICD-10-CM | POA: Diagnosis not present

## 2018-12-04 DIAGNOSIS — M5489 Other dorsalgia: Secondary | ICD-10-CM | POA: Diagnosis present

## 2018-12-04 MED ORDER — DICLOFENAC SODIUM 1 % TD GEL: 2.0000 g | Freq: Four times a day (QID) | TRANSDERMAL | 0 refills | Status: DC | PRN

## 2018-12-04 MED ORDER — IBUPROFEN 800 MG PO TABS: 800.0000 mg | ORAL_TABLET | Freq: Three times a day (TID) | ORAL | 0 refills | Status: DC | PRN
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
## 2018-12-04 NOTE — Discharge Instructions (Signed)

## 2019-06-12 ENCOUNTER — Ambulatory Visit: Payer: BC Managed Care – PPO | Attending: Internal Medicine

## 2019-06-12 DIAGNOSIS — Z23 Encounter for immunization: Secondary | ICD-10-CM

## 2019-06-12 NOTE — Progress Notes (Signed)
   Covid-19 Vaccination Clinic  Name:  Melanie Oliver    MRN: [id number] DOB: [birth year]  06/12/2019  [name] was observed post Covid-19 immunization for 15 minutes without incident. She was provided with Vaccine Information Sheet and instruction to access the V-Safe system.   [name] was instructed to call 911 with any severe reactions post vaccine: Marland Kitchen Difficulty breathing  . Swelling of face and throat  . A fast heartbeat  . A bad rash all over body  . Dizziness and weakness   Immunizations Administered    Name Date Dose VIS Date Route   Moderna COVID-19 Vaccine 06/12/2019 12:33 PM 0.5 mL 01/27/2019 Intramuscular   Manufacturer: Moderna   Lot: [id number]   [id number]

## 2020-04-18 ENCOUNTER — Encounter: Payer: Self-pay | Admitting: Internal Medicine

## 2020-05-19 ENCOUNTER — Ambulatory Visit (INDEPENDENT_AMBULATORY_CARE_PROVIDER_SITE_OTHER): Payer: BC Managed Care – PPO | Admitting: Internal Medicine

## 2020-05-19 ENCOUNTER — Other Ambulatory Visit: Payer: Self-pay

## 2020-05-19 ENCOUNTER — Encounter: Payer: Self-pay | Admitting: *Deleted

## 2020-05-19 ENCOUNTER — Encounter: Payer: Self-pay | Admitting: Internal Medicine

## 2020-05-19 VITALS — BP 153/88 | HR 78 | Temp 97.3°F | Ht 62.0 in | Wt 218.0 lb

## 2020-05-19 DIAGNOSIS — K219 Gastro-esophageal reflux disease without esophagitis: Secondary | ICD-10-CM

## 2020-05-19 DIAGNOSIS — R1030 Lower abdominal pain, unspecified: Secondary | ICD-10-CM | POA: Diagnosis not present

## 2020-05-19 DIAGNOSIS — E559 Vitamin D deficiency, unspecified: Secondary | ICD-10-CM

## 2020-05-19 DIAGNOSIS — R197 Diarrhea, unspecified: Secondary | ICD-10-CM | POA: Diagnosis not present

## 2020-05-19 DIAGNOSIS — K625 Hemorrhage of anus and rectum: Secondary | ICD-10-CM

## 2020-05-19 MED ORDER — CLENPIQ 10-3.5-12 MG-GM -GM/160ML PO SOLN: 1.0000 | Freq: Once | ORAL | 0 refills | Status: AC

## 2020-05-19 NOTE — Progress Notes (Signed)
Pt requested smallest volume prep

## 2020-05-19 NOTE — H&P (View-Only) (Signed)
Primary Care Physician:  Sharilyn Sites, MD Primary Gastroenterologist:  Dr. Abbey Chatters  Chief Complaint  Patient presents with  . Irritable Bowel Syndrome    Loose stools 3-5 times per day. TCS done about 5 years ago and was normal    HPI:   Melanie Oliver is a 60 y.o. female who presents to the clinic today by referral from her PCP Dr. Hilma Favors for evaluation.  Patient has chronic diarrhea that she has been dealing with for many years but has progressively worsened recently.  She states she takes as needed Imodium for this which does help though she is scared to take a medication on a daily basis.  She notes she has been on Imodium for over 2 to 3 years at this point.  She has a prescription for Lomotil but states she has not taken this as she is worried it is too strong.  Also notes some lower abdominal discomfort as well as rectal bleeding.  She states at times she will have "a lot of blood" in the toilet bowl with bowel movements.  Last colonoscopy 2015 by Dr. Arnoldo Morale was unremarkable besides internal hemorrhoids.  Also has chronic heartburn and reflux for which she takes Nexium as needed.  Appears that she had an ENT evaluation in the past with laryngoscopy findings consistent with LPR.  She was placed on Nexium at that time.  She states as long she takes the medication it does help though again she is averse to taking medications every day.  No dysphagia or odynophagia.  No melena hematochezia.  No chronic NSAID use.  Patient also notes history of vitamin D deficiency and is wanting to check this today.  Past Medical History:  Diagnosis Date  . Chiari malformation type I ([place])   . Complication of anesthesia    woke up with a migraine  . Family history of anesthesia complication    aunt wakes up with a migraine  . GERD (gastroesophageal reflux disease)   . Headache(784.0)   . Hypertension   . Neuromuscular disorder ([place])    pinched nerve in neck  . Other and unspecified general  anesthetics causing adverse effect in therapeutic use    Has Chiari malformation- per neurologist "do not over extend neck"     Past Surgical History:  Procedure Laterality Date  . COLONOSCOPY N/A 10/27/2013   Procedure: COLONOSCOPY;  Surgeon: Jamesetta So, MD;  [facility];  Service: Gastroenterology;  Laterality: N/A;  . MOUTH SURGERY    . VAGINAL HYSTERECTOMY  08/27/2011   Procedure: HYSTERECTOMY VAGINAL;  Surgeon: Anastasio Auerbach, MD;  Location: [facility];  Service: Gynecology;  Laterality: N/A;    Current Outpatient Medications  Medication Sig Dispense Refill  . esomeprazole (NEXIUM) 40 MG capsule Take 40 mg by mouth daily as needed. For heartburn    . loperamide (IMODIUM) 2 MG capsule Take by mouth as needed for diarrhea or loose stools.     No current facility-administered medications for this visit.    Allergies as of 05/19/2020 - Review Complete 05/19/2020  Allergen Reaction Noted  . Codeine Nausea And Vomiting   . Levofloxacin  05/19/2020  . Sulfa antibiotics  05/19/2020    Family History  Problem Relation Age of Onset  . Cancer Mother        brain tumor  . Cancer Father        Non Hodgkins lymphoma  . Alzheimer's disease Father   . Hypertension Sister   .  Diabetes Sister   . Diabetes Paternal Aunt   . Diabetes Maternal Grandfather   . Hypertension Paternal Grandmother   . Heart disease Paternal Grandmother   . Colon cancer Neg Hx     Social History   Socioeconomic History  . Marital status: Married    Spouse name: Not on file  . Number of children: Not on file  . Years of education: Not on file  . Highest education level: Not on file  Occupational History  . Not on file  Tobacco Use  . Smoking status: Never Smoker  . Smokeless tobacco: Never Used  Substance and Sexual Activity  . Alcohol use: No  . Drug use: No  . Sexual activity: Yes    Birth control/protection: Surgical    Comment: Hyst  Other Topics Concern  . Not on file   Social History Narrative  . Not on file   Social Determinants of Health   Financial Resource Strain: Not on file  Food Insecurity: Not on file  Transportation Needs: Not on file  Physical Activity: Not on file  Stress: Not on file  Social Connections: Not on file  Intimate Partner Violence: Not on file    Subjective: Review of Systems  Constitutional: Negative for chills and fever.  HENT: Negative for congestion and hearing loss.   Eyes: Negative for blurred vision and double vision.  Respiratory: Negative for cough and shortness of breath.   Cardiovascular: Negative for chest pain and palpitations.  Gastrointestinal: Positive for abdominal pain and diarrhea. Negative for blood in stool, constipation, heartburn, melena and vomiting.  Genitourinary: Negative for dysuria and urgency.  Musculoskeletal: Negative for joint pain and myalgias.  Skin: Negative for itching and rash.  Neurological: Negative for dizziness and headaches.  Psychiatric/Behavioral: Negative for depression. The patient is not nervous/anxious.        Objective: BP (!) 153/88   Pulse 78   Temp (!) 97.3 F (36.3 C)   Ht 5\' 2"  (1.575 m)   Wt 218 lb (98.9 kg)   LMP 06/25/2011   BMI 39.87 kg/m  Physical Exam Constitutional:      Appearance: Normal appearance. She is obese.  HENT:     Head: Normocephalic and atraumatic.  Eyes:     Extraocular Movements: Extraocular movements intact.     Conjunctiva/sclera: Conjunctivae normal.  Cardiovascular:     Rate and Rhythm: Normal rate and regular rhythm.  Pulmonary:     Effort: Pulmonary effort is normal.     Breath sounds: Normal breath sounds.  Abdominal:     General: Bowel sounds are normal.     Palpations: Abdomen is soft.  Musculoskeletal:        General: No swelling. Normal range of motion.     Cervical back: Normal range of motion and neck supple.  Skin:    General: Skin is warm and dry.     Coloration: Skin is not jaundiced.  Neurological:      General: No focal deficit present.     Mental Status: She is alert and oriented to person, place, and time.  Psychiatric:        Mood and Affect: Mood normal.        Behavior: Behavior normal.      Assessment: *GERD *Diarrhea *Rectal bleeding *Lower abdominal pain *Vitamin D deficiency  Plan: Patient's GERD not well controlled currently though patient does admit to not taking her Nexium every day.  Etiology of her diarrhea, abdominal pain unclear likely irritable bowel  syndrome but will need to rule out other conditions.  Rectal bleeding likely due to hemorrhoids as well but we will need to evaluate this.  Will schedule for EGD to evaluate for peptic ulcer disease, esophagitis, gastritis, H. Pylori, duodenitis, or other. Will also evaluate for esophageal stricture, Schatzki's ring, esophageal web or other.   At the same time we will perform colonoscopy with random colon biopsies to rule out microscopic colitis, underline inflammatory bowel disease such as Crohn's disease or ulcerative colitis, polyps, malignancy, or other.  The risks including infection, bleed, or perforation as well as benefits, limitations, alternatives and imponderables have been reviewed with the patient. Potential for esophageal dilation, biopsy, etc. have also been reviewed.  Questions have been answered. All parties agreeable.  Continue on Nexium as needed.  Depending on endoscopic evaluation, patient may need to take this more frequent.  Continue Imodium as needed for diarrhea.  I will check TSH and celiac panel today given her ongoing diarrhea.  Check vitamin D level today.  Thank you Dr. Hilma Favors for the kind referral.  05/19/2020 11:16 AM   Disclaimer: This note was dictated with voice recognition software. Similar sounding words can inadvertently be transcribed and may not be corrected upon review.

## 2020-05-19 NOTE — Progress Notes (Signed)
Primary Care Physician:  Sharilyn Sites, MD Primary Gastroenterologist:  Dr. Abbey Chatters  Chief Complaint  Patient presents with  . Irritable Bowel Syndrome    Loose stools 3-5 times per day. TCS done about 5 years ago and was normal    HPI:   Melanie Oliver is a 60 y.o. female who presents to the clinic today by referral from her PCP Dr. Hilma Favors for evaluation.  Patient has chronic diarrhea that she has been dealing with for many years but has progressively worsened recently.  She states she takes as needed Imodium for this which does help though she is scared to take a medication on a daily basis.  She notes she has been on Imodium for over 2 to 3 years at this point.  She has a prescription for Lomotil but states she has not taken this as she is worried it is too strong.  Also notes some lower abdominal discomfort as well as rectal bleeding.  She states at times she will have "a lot of blood" in the toilet bowl with bowel movements.  Last colonoscopy 2015 by Dr. Arnoldo Morale was unremarkable besides internal hemorrhoids.  Also has chronic heartburn and reflux for which she takes Nexium as needed.  Appears that she had an ENT evaluation in the past with laryngoscopy findings consistent with LPR.  She was placed on Nexium at that time.  She states as long she takes the medication it does help though again she is averse to taking medications every day.  No dysphagia or odynophagia.  No melena hematochezia.  No chronic NSAID use.  Patient also notes history of vitamin D deficiency and is wanting to check this today.  Past Medical History:  Diagnosis Date  . Chiari malformation type I ([place])   . Complication of anesthesia    woke up with a migraine  . Family history of anesthesia complication    aunt wakes up with a migraine  . GERD (gastroesophageal reflux disease)   . Headache(784.0)   . Hypertension   . Neuromuscular disorder ([place])    pinched nerve in neck  . Other and unspecified general  anesthetics causing adverse effect in therapeutic use    Has Chiari malformation- per neurologist "do not over extend neck"     Past Surgical History:  Procedure Laterality Date  . COLONOSCOPY N/A 10/27/2013   Procedure: COLONOSCOPY;  Surgeon: Jamesetta So, MD;  [facility]: [facility];  Service: Gastroenterology;  Laterality: N/A;  . MOUTH SURGERY    . VAGINAL HYSTERECTOMY  08/27/2011   Procedure: HYSTERECTOMY VAGINAL;  Surgeon: Anastasio Auerbach, MD;  Location: [facility];  Service: Gynecology;  Laterality: N/A;    Current Outpatient Medications  Medication Sig Dispense Refill  . esomeprazole (NEXIUM) 40 MG capsule Take 40 mg by mouth daily as needed. For heartburn    . loperamide (IMODIUM) 2 MG capsule Take by mouth as needed for diarrhea or loose stools.     No current facility-administered medications for this visit.    Allergies as of 05/19/2020 - Review Complete 05/19/2020  Allergen Reaction Noted  . Codeine Nausea And Vomiting   . Levofloxacin  05/19/2020  . Sulfa antibiotics  05/19/2020    Family History  Problem Relation Age of Onset  . Cancer Mother        brain tumor  . Cancer Father        Non Hodgkins lymphoma  . Alzheimer's disease Father   . Hypertension Sister   .  Diabetes Sister   . Diabetes Paternal Aunt   . Diabetes Maternal Grandfather   . Hypertension Paternal Grandmother   . Heart disease Paternal Grandmother   . Colon cancer Neg Hx     Social History   Socioeconomic History  . Marital status: Married    Spouse name: Not on file  . Number of children: Not on file  . Years of education: Not on file  . Highest education level: Not on file  Occupational History  . Not on file  Tobacco Use  . Smoking status: Never Smoker  . Smokeless tobacco: Never Used  Substance and Sexual Activity  . Alcohol use: No  . Drug use: No  . Sexual activity: Yes    Birth control/protection: Surgical    Comment: Hyst  Other Topics Concern  . Not on file   Social History Narrative  . Not on file   Social Determinants of Health   Financial Resource Strain: Not on file  Food Insecurity: Not on file  Transportation Needs: Not on file  Physical Activity: Not on file  Stress: Not on file  Social Connections: Not on file  Intimate Partner Violence: Not on file    Subjective: Review of Systems  Constitutional: Negative for chills and fever.  HENT: Negative for congestion and hearing loss.   Eyes: Negative for blurred vision and double vision.  Respiratory: Negative for cough and shortness of breath.   Cardiovascular: Negative for chest pain and palpitations.  Gastrointestinal: Positive for abdominal pain and diarrhea. Negative for blood in stool, constipation, heartburn, melena and vomiting.  Genitourinary: Negative for dysuria and urgency.  Musculoskeletal: Negative for joint pain and myalgias.  Skin: Negative for itching and rash.  Neurological: Negative for dizziness and headaches.  Psychiatric/Behavioral: Negative for depression. The patient is not nervous/anxious.        Objective: BP (!) 153/88   Pulse 78   Temp (!) 97.3 F (36.3 C)   Ht 5\' 2"  (1.575 m)   Wt 218 lb (98.9 kg)   LMP 06/25/2011   BMI 39.87 kg/m  Physical Exam Constitutional:      Appearance: Normal appearance. She is obese.  HENT:     Head: Normocephalic and atraumatic.  Eyes:     Extraocular Movements: Extraocular movements intact.     Conjunctiva/sclera: Conjunctivae normal.  Cardiovascular:     Rate and Rhythm: Normal rate and regular rhythm.  Pulmonary:     Effort: Pulmonary effort is normal.     Breath sounds: Normal breath sounds.  Abdominal:     General: Bowel sounds are normal.     Palpations: Abdomen is soft.  Musculoskeletal:        General: No swelling. Normal range of motion.     Cervical back: Normal range of motion and neck supple.  Skin:    General: Skin is warm and dry.     Coloration: Skin is not jaundiced.  Neurological:      General: No focal deficit present.     Mental Status: She is alert and oriented to person, place, and time.  Psychiatric:        Mood and Affect: Mood normal.        Behavior: Behavior normal.      Assessment: *GERD *Diarrhea *Rectal bleeding *Lower abdominal pain *Vitamin D deficiency  Plan: Patient's GERD not well controlled currently though patient does admit to not taking her Nexium every day.  Etiology of her diarrhea, abdominal pain unclear likely irritable bowel  syndrome but will need to rule out other conditions.  Rectal bleeding likely due to hemorrhoids as well but we will need to evaluate this.  Will schedule for EGD to evaluate for peptic ulcer disease, esophagitis, gastritis, H. Pylori, duodenitis, or other. Will also evaluate for esophageal stricture, Schatzki's ring, esophageal web or other.   At the same time we will perform colonoscopy with random colon biopsies to rule out microscopic colitis, underline inflammatory bowel disease such as Crohn's disease or ulcerative colitis, polyps, malignancy, or other.  The risks including infection, bleed, or perforation as well as benefits, limitations, alternatives and imponderables have been reviewed with the patient. Potential for esophageal dilation, biopsy, etc. have also been reviewed.  Questions have been answered. All parties agreeable.  Continue on Nexium as needed.  Depending on endoscopic evaluation, patient may need to take this more frequent.  Continue Imodium as needed for diarrhea.  I will check TSH and celiac panel today given her ongoing diarrhea.  Check vitamin D level today.  Thank you Dr. Hilma Favors for the kind referral.  05/19/2020 11:16 AM   Disclaimer: This note was dictated with voice recognition software. Similar sounding words can inadvertently be transcribed and may not be corrected upon review.

## 2020-05-19 NOTE — Patient Instructions (Signed)
We will schedule you for EGD to further evaluate your chronic reflux and indigestion.  At the same time we will perform colonoscopy to evaluate your diarrhea and rectal bleeding.  Continue to take Nexium as needed.  Continue to take Imodium as needed.    Further recommendations to follow.  At [facility] we value your feedback. You may receive a survey about your visit today. Please share your experience as we strive to create trusting relationships with our patients to provide genuine, compassionate, quality care.  We appreciate your understanding and patience as we review any laboratory studies, imaging, and other diagnostic tests that are ordered as we care for you. Our office policy is 5 business days for review of these results, and any emergent or urgent results are addressed in a timely manner for your best interest. If you do not hear from our office in 1 week, please contact us.   We also encourage the use of MyChart, which contains your medical information for your review as well. If you are not enrolled in this feature, an access code is on this after visit summary for your convenience. Thank you for allowing [place] to be involved in your care.  It was great to see you today!  I hope you have a great rest of your spring!!    Elon Alas. Abbey Chatters, D.O. Gastroenterology and Hepatology [facility]

## 2020-05-20 LAB — CELIAC AB TTG DGP TIGA
Antigliadin Abs, IgA: 3 units (ref 0–19)
Gliadin IgG: 2 units (ref 0–19)
IgA/Immunoglobulin A, Serum: 166 mg/dL (ref 87–352)

## 2020-05-20 LAB — TSH+FREE T4: Free T4: 1.25 ng/dL (ref 0.82–1.77)

## 2020-05-22 LAB — CELIAC AB TTG DGP TIGA
Tissue Transglut Ab: 6 U/mL — ABNORMAL HIGH (ref 0–5)
Transglutaminase IgA: <2 U/mL (ref 0–3)

## 2020-05-22 LAB — TSH+FREE T4: TSH: 1.33 u[IU]/mL (ref 0.450–4.500)

## 2020-05-22 LAB — VITAMIN D 25 HYDROXY (VIT D DEFICIENCY, FRACTURES): Vit D, 25-Hydroxy: 37.3 ng/mL (ref 30.0–100.0)

## 2020-05-26 NOTE — Progress Notes (Signed)
Dear Melanie Oliver,  Your Thyroid and Celiac Panel are unremarkable. Please proceed with EGD and colonoscopy as planned.   If you have any questions or concerns please give our office a call.   Thank you, Oleh Genin, CMA

## 2020-06-15 ENCOUNTER — Other Ambulatory Visit (HOSPITAL_COMMUNITY)
Admission: RE | Admit: 2020-06-15 | Discharge: 2020-06-15 | Disposition: A | Discharge status: Home / Self Care | Payer: BC Managed Care – PPO | Source: Ambulatory Visit | Attending: Internal Medicine | Admitting: Internal Medicine

## 2020-06-15 ENCOUNTER — Other Ambulatory Visit: Payer: Self-pay

## 2020-06-15 DIAGNOSIS — K648 Other hemorrhoids: Secondary | ICD-10-CM | POA: Diagnosis not present

## 2020-06-15 DIAGNOSIS — K5730 Diverticulosis of large intestine without perforation or abscess without bleeding: Secondary | ICD-10-CM | POA: Diagnosis not present

## 2020-06-15 DIAGNOSIS — K2289 Other specified disease of esophagus: Secondary | ICD-10-CM | POA: Diagnosis not present

## 2020-06-15 DIAGNOSIS — R1013 Epigastric pain: Secondary | ICD-10-CM | POA: Diagnosis not present

## 2020-06-15 DIAGNOSIS — Z882 Allergy status to sulfonamides status: Secondary | ICD-10-CM | POA: Diagnosis not present

## 2020-06-15 DIAGNOSIS — Z885 Allergy status to narcotic agent status: Secondary | ICD-10-CM | POA: Diagnosis not present

## 2020-06-15 DIAGNOSIS — R110 Nausea: Secondary | ICD-10-CM | POA: Diagnosis not present

## 2020-06-15 DIAGNOSIS — K2100 Gastro-esophageal reflux disease with esophagitis, without bleeding: Secondary | ICD-10-CM | POA: Diagnosis not present

## 2020-06-15 DIAGNOSIS — D124 Benign neoplasm of descending colon: Secondary | ICD-10-CM | POA: Diagnosis not present

## 2020-06-15 DIAGNOSIS — Z01812 Encounter for preprocedural laboratory examination: Secondary | ICD-10-CM | POA: Insufficient documentation

## 2020-06-15 DIAGNOSIS — K580 Irritable bowel syndrome with diarrhea: Secondary | ICD-10-CM | POA: Diagnosis not present

## 2020-06-15 DIAGNOSIS — Z881 Allergy status to other antibiotic agents status: Secondary | ICD-10-CM | POA: Diagnosis not present

## 2020-06-15 DIAGNOSIS — Z20822 Contact with and (suspected) exposure to covid-19: Secondary | ICD-10-CM | POA: Insufficient documentation

## 2020-06-15 DIAGNOSIS — R1030 Lower abdominal pain, unspecified: Secondary | ICD-10-CM | POA: Diagnosis not present

## 2020-06-15 DIAGNOSIS — K449 Diaphragmatic hernia without obstruction or gangrene: Secondary | ICD-10-CM | POA: Diagnosis not present

## 2020-06-15 DIAGNOSIS — K2970 Gastritis, unspecified, without bleeding: Secondary | ICD-10-CM | POA: Diagnosis not present

## 2020-06-15 DIAGNOSIS — Z6841 Body Mass Index (BMI) 40.0 and over, adult: Secondary | ICD-10-CM | POA: Diagnosis not present

## 2020-06-15 DIAGNOSIS — K3189 Other diseases of stomach and duodenum: Secondary | ICD-10-CM | POA: Diagnosis not present

## 2020-06-15 DIAGNOSIS — Z79899 Other long term (current) drug therapy: Secondary | ICD-10-CM | POA: Diagnosis not present

## 2020-06-16 ENCOUNTER — Telehealth: Payer: Self-pay | Admitting: Internal Medicine

## 2020-06-16 LAB — SARS CORONAVIRUS 2 (TAT 6-24 HRS): SARS Coronavirus 2: NEGATIVE

## 2020-06-16 NOTE — Telephone Encounter (Signed)
Per Melanie in endo Dr. Mertha Finders is on for tomorrow.  Called pt and made aware. Also made aware her TCS is coded as diagnostic as she is having problems. She voiced understanding

## 2020-06-16 NOTE — Telephone Encounter (Signed)
Pt has questions about how her procedure will be coded tomorrow.  She said she was told that she will need to pay out of pocket, then she wanted to know who the anesthesiologist  was so she can make sure he was in network. She said the hospital told her to call us. Please call [phone]

## 2020-06-17 ENCOUNTER — Encounter (HOSPITAL_COMMUNITY): Payer: Self-pay

## 2020-06-17 ENCOUNTER — Ambulatory Visit (HOSPITAL_COMMUNITY)
Admission: RE | Admit: 2020-06-17 | Discharge: 2020-06-17 | Disposition: A | Discharge status: Home / Self Care | Payer: BC Managed Care – PPO | Attending: Internal Medicine | Admitting: Internal Medicine

## 2020-06-17 ENCOUNTER — Ambulatory Visit (HOSPITAL_COMMUNITY): Payer: BC Managed Care – PPO | Admitting: Anesthesiology

## 2020-06-17 ENCOUNTER — Other Ambulatory Visit: Payer: Self-pay

## 2020-06-17 ENCOUNTER — Encounter (HOSPITAL_COMMUNITY)
Admission: RE | Disposition: A | Discharge status: Home / Self Care | Payer: Self-pay | Source: Home / Self Care | Attending: Internal Medicine

## 2020-06-17 DIAGNOSIS — K2289 Other specified disease of esophagus: Secondary | ICD-10-CM | POA: Insufficient documentation

## 2020-06-17 DIAGNOSIS — Z79899 Other long term (current) drug therapy: Secondary | ICD-10-CM | POA: Insufficient documentation

## 2020-06-17 DIAGNOSIS — D124 Benign neoplasm of descending colon: Secondary | ICD-10-CM | POA: Insufficient documentation

## 2020-06-17 DIAGNOSIS — R110 Nausea: Secondary | ICD-10-CM | POA: Insufficient documentation

## 2020-06-17 DIAGNOSIS — R1030 Lower abdominal pain, unspecified: Secondary | ICD-10-CM | POA: Diagnosis not present

## 2020-06-17 DIAGNOSIS — Z881 Allergy status to other antibiotic agents status: Secondary | ICD-10-CM | POA: Insufficient documentation

## 2020-06-17 DIAGNOSIS — K529 Noninfective gastroenteritis and colitis, unspecified: Secondary | ICD-10-CM | POA: Diagnosis not present

## 2020-06-17 DIAGNOSIS — K635 Polyp of colon: Secondary | ICD-10-CM

## 2020-06-17 DIAGNOSIS — K3189 Other diseases of stomach and duodenum: Secondary | ICD-10-CM | POA: Insufficient documentation

## 2020-06-17 DIAGNOSIS — K2100 Gastro-esophageal reflux disease with esophagitis, without bleeding: Secondary | ICD-10-CM | POA: Diagnosis not present

## 2020-06-17 DIAGNOSIS — Z20822 Contact with and (suspected) exposure to covid-19: Secondary | ICD-10-CM | POA: Insufficient documentation

## 2020-06-17 DIAGNOSIS — Z882 Allergy status to sulfonamides status: Secondary | ICD-10-CM | POA: Insufficient documentation

## 2020-06-17 DIAGNOSIS — K580 Irritable bowel syndrome with diarrhea: Secondary | ICD-10-CM | POA: Insufficient documentation

## 2020-06-17 DIAGNOSIS — K5730 Diverticulosis of large intestine without perforation or abscess without bleeding: Secondary | ICD-10-CM | POA: Insufficient documentation

## 2020-06-17 DIAGNOSIS — Z885 Allergy status to narcotic agent status: Secondary | ICD-10-CM | POA: Insufficient documentation

## 2020-06-17 DIAGNOSIS — K449 Diaphragmatic hernia without obstruction or gangrene: Secondary | ICD-10-CM | POA: Insufficient documentation

## 2020-06-17 DIAGNOSIS — Z6841 Body Mass Index (BMI) 40.0 and over, adult: Secondary | ICD-10-CM | POA: Insufficient documentation

## 2020-06-17 DIAGNOSIS — K2970 Gastritis, unspecified, without bleeding: Secondary | ICD-10-CM

## 2020-06-17 DIAGNOSIS — R1013 Epigastric pain: Secondary | ICD-10-CM | POA: Insufficient documentation

## 2020-06-17 DIAGNOSIS — K648 Other hemorrhoids: Secondary | ICD-10-CM | POA: Insufficient documentation

## 2020-06-17 HISTORY — PX: COLONOSCOPY WITH PROPOFOL: SHX5780

## 2020-06-17 HISTORY — PX: ESOPHAGOGASTRODUODENOSCOPY (EGD) WITH PROPOFOL: SHX5813

## 2020-06-17 HISTORY — PX: BIOPSY: SHX5522

## 2020-06-17 HISTORY — PX: POLYPECTOMY: SHX5525

## 2020-06-17 LAB — KOH PREP: KOH Prep: NONE SEEN

## 2020-06-17 SURGERY — COLONOSCOPY WITH PROPOFOL
Anesthesia: General

## 2020-06-17 MED ORDER — LACTATED RINGERS IV SOLN: INTRAVENOUS | Status: DC

## 2020-06-17 MED ORDER — ESOMEPRAZOLE MAGNESIUM 20 MG PO CPDR: 20.0000 mg | DELAYED_RELEASE_CAPSULE | Freq: Two times a day (BID) | ORAL | 5 refills | Status: DC

## 2020-06-17 MED ORDER — PROPOFOL 500 MG/50ML IV EMUL
INTRAVENOUS | Status: DC | PRN
  Administered 2020-06-17: 150 ug/kg/min via INTRAVENOUS

## 2020-06-17 MED ORDER — STERILE WATER FOR IRRIGATION IR SOLN
Status: DC | PRN
  Administered 2020-06-17: 1.5 mL

## 2020-06-17 NOTE — Anesthesia Preprocedure Evaluation (Addendum)
Anesthesia Evaluation  Patient identified by MRN, date of birth, ID band Patient awake    Reviewed: Allergy & Precautions, H&P , NPO status , Patient's Chart, lab work & pertinent test results, reviewed documented beta blocker date and time   Airway Mallampati: II  TM Distance: >3 FB Neck ROM: full    Dental no notable dental hx.    Pulmonary neg pulmonary ROS,    Pulmonary exam normal breath sounds clear to auscultation       Cardiovascular Exercise Tolerance: Good hypertension, negative cardio ROS   Rhythm:regular Rate:Normal     Neuro/Psych  Headaches,  Neuromuscular disease negative psych ROS   GI/Hepatic Neg liver ROS, GERD  Medicated,  Endo/Other  Morbid obesity  Renal/GU negative Renal ROS  negative genitourinary   Musculoskeletal   Abdominal   Peds  Hematology negative hematology ROS (+)   Anesthesia Other Findings   Reproductive/Obstetrics negative OB ROS                            Anesthesia Physical Anesthesia Plan  ASA: III  Anesthesia Plan: General   Post-op Pain Management:    Induction:   PONV Risk Score and Plan: Propofol infusion  Airway Management Planned:   Additional Equipment:   Intra-op Plan:   Post-operative Plan:   Informed Consent: I have reviewed the patients History and Physical, chart, labs and discussed the procedure including the risks, benefits and alternatives for the proposed anesthesia with the patient or authorized representative who has indicated his/her understanding and acceptance.     Dental Advisory Given  Plan Discussed with: CRNA  Anesthesia Plan Comments:        Anesthesia Quick Evaluation

## 2020-06-17 NOTE — Discharge Instructions (Addendum)
EGD Discharge instructions Please read the instructions outlined below and refer to this sheet in the next few weeks. These discharge instructions provide you with general information on caring for yourself after you leave the hospital. Your doctor may also give you specific instructions. While your treatment has been planned according to the most current medical practices available, unavoidable complications occasionally occur. If you have any problems or questions after discharge, please call your doctor. ACTIVITY  You may resume your regular activity but move at a slower pace for the next 24 hours.   Take frequent rest periods for the next 24 hours.   Walking will help expel (get rid of) the air and reduce the bloated feeling in your abdomen.   No driving for 24 hours (because of the anesthesia (medicine) used during the test).   You may shower.   Do not sign any important legal documents or operate any machinery for 24 hours (because of the anesthesia used during the test).  NUTRITION  Drink plenty of fluids.   You may resume your normal diet.   Begin with a light meal and progress to your normal diet.   Avoid alcoholic beverages for 24 hours or as instructed by your caregiver.  MEDICATIONS  You may resume your normal medications unless your caregiver tells you otherwise.  WHAT YOU CAN EXPECT TODAY  You may experience abdominal discomfort such as a feeling of fullness or "gas" pains.  FOLLOW-UP  Your doctor will discuss the results of your test with you.  SEEK IMMEDIATE MEDICAL ATTENTION IF ANY OF THE FOLLOWING OCCUR:  Excessive nausea (feeling sick to your stomach) and/or vomiting.   Severe abdominal pain and distention (swelling).   Trouble swallowing.   Temperature over 101 F (37.8 C).   Rectal bleeding or vomiting of blood.    Colonoscopy Discharge Instructions  Read the instructions outlined below and refer to this sheet in the next few weeks. These  discharge instructions provide you with general information on caring for yourself after you leave the hospital. Your doctor may also give you specific instructions. While your treatment has been planned according to the most current medical practices available, unavoidable complications occasionally occur.   ACTIVITY  You may resume your regular activity, but move at a slower pace for the next 24 hours.   Take frequent rest periods for the next 24 hours.   Walking will help get rid of the air and reduce the bloated feeling in your belly (abdomen).   No driving for 24 hours (because of the medicine (anesthesia) used during the test).    Do not sign any important legal documents or operate any machinery for 24 hours (because of the anesthesia used during the test).  NUTRITION  Drink plenty of fluids.   You may resume your normal diet as instructed by your doctor.   Begin with a light meal and progress to your normal diet. Heavy or fried foods are harder to digest and may make you feel sick to your stomach (nauseated).   Avoid alcoholic beverages for 24 hours or as instructed.  MEDICATIONS  You may resume your normal medications unless your doctor tells you otherwise.  WHAT YOU CAN EXPECT TODAY  Some feelings of bloating in the abdomen.   Passage of more gas than usual.   Spotting of blood in your stool or on the toilet paper.  IF YOU HAD POLYPS REMOVED DURING THE COLONOSCOPY:  No aspirin products for 7 days or as instructed.  No alcohol for 7 days or as instructed.   Eat a soft diet for the next 24 hours.  FINDING OUT THE RESULTS OF YOUR TEST Not all test results are available during your visit. If your test results are not back during the visit, make an appointment with your caregiver to find out the results. Do not assume everything is normal if you have not heard from your caregiver or the medical facility. It is important for you to follow up on all of your test results.   SEEK IMMEDIATE MEDICAL ATTENTION IF:  You have more than a spotting of blood in your stool.   Your belly is swollen (abdominal distention).   You are nauseated or vomiting.   You have a temperature over 101.   You have abdominal pain or discomfort that is severe or gets worse throughout the day.   Your EGD revealed a large amount inflammation in your esophagus and your stomach.  I took biopsies of your stomach and esophagus to rule out underlying infection.  You also have a large hiatal hernia.  I want you to increase your Nexium to 20 mg twice daily for the next 8 weeks.  After that time you can go down to once daily.  Avoid NSAIDs.  Your colonoscopy was relatively unremarkable.  I did find 1 polyp which I removed successfully.  I also took biopsies your entire colon to rule out microscopic colitis which can cause diarrhea especially in females.  Await pathology results, my office will contact you next week.  Follow-up with GI in 2 to 3 months.  Repeat colonoscopy in 5 years for surveillance purposes.  I hope you have a great rest of your week!  Elon Alas. Abbey Chatters, D.O. Gastroenterology and Hepatology [facility]   Colon Polyps  Colon polyps are tissue growths inside the colon, which is part of the large intestine. They are one of the types of polyps that can grow in the body. A polyp may be a round bump or a mushroom-shaped growth. You could have one polyp or more than one. Most colon polyps are noncancerous (benign). However, some colon polyps can become cancerous over time. Finding and removing the polyps early can help prevent this. What are the causes? The exact cause of colon polyps is not known. What increases the risk? The following factors may make you more likely to develop this condition:  Having a family history of colorectal cancer or colon polyps.  Being older than 60 years of age.  Being younger than 60 years of age and having a  significant family history of colorectal cancer or colon polyps or a genetic condition that puts you at higher risk of getting colon polyps.  Having inflammatory bowel disease, such as ulcerative colitis or Crohn's disease.  Having certain conditions passed from parent to child (hereditary conditions), such as: ? Familial adenomatous polyposis (FAP). ? Lynch syndrome. ? Turcot syndrome. ? Peutz-Jeghers syndrome. ? MUTYH-associated polyposis (MAP).  Being overweight.  Certain lifestyle factors. These include smoking cigarettes, drinking too much alcohol, not getting enough exercise, and eating a diet that is high in fat and red meat and low in fiber.  Having had childhood cancer that was treated with radiation of the abdomen. What are the signs or symptoms? Many times, there are no symptoms. If you have symptoms, they may include:  Blood coming from the rectum during a bowel movement.  Blood in the stool (feces). The blood may be bright red or very  dark in color.  Pain in the abdomen.  A change in bowel habits, such as constipation or diarrhea. How is this diagnosed? This condition is diagnosed with a colonoscopy. This is a procedure in which a lighted, flexible scope is inserted into the opening between the buttocks (anus) and then passed into the colon to examine the area. Polyps are sometimes found when a colonoscopy is done as part of routine cancer screening tests. How is this treated? This condition is treated by removing any polyps that are found. Most polyps can be removed during a colonoscopy. Those polyps will then be tested for cancer. Additional treatment may be needed depending on the results of testing. Follow these instructions at home: Eating and drinking  Eat foods that are high in fiber, such as fruits, vegetables, and whole grains.  Eat foods that are high in calcium and vitamin D, such as milk, cheese, yogurt, eggs, liver, fish, and broccoli.  Limit foods that  are high in fat, such as fried foods and desserts.  Limit the amount of red meat, precooked or cured meat, or other processed meat that you eat, such as hot dogs, sausages, bacon, or meat loaves.  Limit sugary drinks.   Lifestyle  Maintain a healthy weight, or lose weight if recommended by your health care provider.  Exercise every day or as told by your health care provider.  Do not use any products that contain nicotine or tobacco, such as cigarettes, e-cigarettes, and chewing tobacco. If you need help quitting, ask your health care provider.  Do not drink alcohol if: ? Your health care provider tells you not to drink. ? You are pregnant, may be pregnant, or are planning to become pregnant.  If you drink alcohol: ? Limit how much you use to:  0-1 drink a day for women.  0-2 drinks a day for men. ? Know how much alcohol is in your drink. In the [place] one drink equals one 12 oz bottle of beer (355 mL), one 5 oz glass of wine (148 mL), or one 1 oz glass of hard liquor (44 mL). General instructions  Take over-the-counter and prescription medicines only as told by your health care provider.  Keep all follow-up visits. This is important. This includes having regularly scheduled colonoscopies. Talk to your health care provider about when you need a colonoscopy. Contact a health care provider if:  You have new or worsening bleeding during a bowel movement.  You have new or increased blood in your stool.  You have a change in bowel habits.  You lose weight for no known reason. Summary  Colon polyps are tissue growths inside the colon, which is part of the large intestine. They are one type of polyp that can grow in the body.  Most colon polyps are noncancerous (benign), but some can become cancerous over time.  This condition is diagnosed with a colonoscopy.  This condition is treated by removing any polyps that are found. Most polyps can be removed during a colonoscopy. This  information is not intended to replace advice given to you by your health care provider. Make sure you discuss any questions you have with your health care provider. Document Revised: 06/03/2019 Document Reviewed: 06/03/2019 Elsevier Patient Education  2021 [employer].

## 2020-06-17 NOTE — Anesthesia Postprocedure Evaluation (Signed)
Anesthesia Post Note  Patient: LALAH DURANGO  Procedure(s) Performed: COLONOSCOPY WITH PROPOFOL (N/A ) ESOPHAGOGASTRODUODENOSCOPY (EGD) WITH PROPOFOL (N/A ) BIOPSY POLYPECTOMY  Patient location during evaluation: Phase II Anesthesia Type: General Level of consciousness: awake, oriented and awake and alert Pain management: pain level controlled Vital Signs Assessment: post-procedure vital signs reviewed and stable Respiratory status: spontaneous breathing, respiratory function stable and nonlabored ventilation Cardiovascular status: stable Postop Assessment: no apparent nausea or vomiting Anesthetic complications: no   No complications documented.   Last Vitals:  Vitals:   06/17/20 1032 06/17/20 1212  BP: (!) 166/94   Pulse: 85   Resp: 15 16  Temp: 36.8 C 36.8 C  SpO2: 99% 97%    Last Pain:  Vitals:   06/17/20 1212  TempSrc: Oral  PainSc: 0-No pain                 Sora Olivo

## 2020-06-17 NOTE — Op Note (Signed)
[facility] Patient Name: Melanie Oliver Procedure Date: 06/17/2020 11:34 AM MRN: [id number] Date of Birth: 03-Jun-1960 Attending MD: Elon Alas. Abbey Chatters DO CSN: [id number] Age: 60 Admit Type: Outpatient Procedure:                Upper GI endoscopy Indications:              Epigastric abdominal pain, Diarrhea, Nausea Providers:                Elon Alas. Abbey Chatters, DO, Charlsie Quest. Theda Sers RN, RN,                            Aram Candela Referring MD:              Medicines:                See the Anesthesia note for documentation of the                            administered medications Complications:            No immediate complications. Estimated Blood Loss:     Estimated blood loss was minimal. Procedure:                Pre-Anesthesia Assessment:                           - The anesthesia plan was to use monitored                            anesthesia care (MAC).                           After obtaining informed consent, the endoscope was                            passed under direct vision. Throughout the                            procedure, the patient's blood pressure, pulse, and                            oxygen saturations were monitored continuously. The                            [id number]-[id number] ([id number]) scope was introduced through the                            mouth, and advanced to the second part of duodenum.                            The upper GI endoscopy was accomplished without                            difficulty. The patient tolerated the procedure                            well. Scope  In: 11:48:44 AM Scope Out: 11:52:39 AM Total Procedure Duration: 0 hours 3 minutes 55 seconds  Findings:      Mucosal changes including ringed esophagus, longitudinal furrows and       white plaques were found in the middle third of the esophagus.       Esophageal findings were graded using the Eosinophilic Esophagitis       Endoscopic Reference Score (EoE-EREFS) as: Edema  Grade 0 Normal       (distinct vascular markings), Rings Grade 2 Moderate (distinct rings       that do not occlude passage of diagnostic 8-10 mm endoscope), Exudates       Grade 1 Mild (scattered white lesions involving less than 10 percent of       the esophageal surface area), Furrows Grade 1 Present (vertical lines       with or without visible depth) and Stricture none (no stricture found).       Biopsies were taken with a cold forceps for histology.      A medium-sized hiatal hernia was present.      LA Grade C (one or more mucosal breaks continuous between tops of 2 or       more mucosal folds, less than 75% circumference) esophagitis with no       bleeding was found at the gastroesophageal junction.      Diffuse mild inflammation characterized by erythema was found in the       entire examined stomach. Biopsies were taken with a cold forceps for       Helicobacter pylori testing.      The duodenal bulb, first portion of the duodenum and second portion of       the duodenum were normal. Impression:               - Esophageal mucosal changes suspicious for                            eosinophilic esophagitis. Biopsied.                           - Medium-sized hiatal hernia.                           - LA Grade C reflux esophagitis with no bleeding.                           - Gastritis. Biopsied.                           - Normal duodenal bulb, first portion of the                            duodenum and second portion of the duodenum. Moderate Sedation:      Per Anesthesia Care Recommendation:           - Patient has a contact number available for                            emergencies. The signs and symptoms of potential  delayed complications were discussed with the                            patient. Return to normal activities tomorrow.                            Written discharge instructions were provided to the                             patient.                           - Resume previous diet.                           - Continue present medications.                           - Await pathology results.                           - Return to GI clinic in 3 months.                           - Use a proton pump inhibitor PO BID. Procedure Code(s):        --- Professional ---                           [phone], Esophagogastroduodenoscopy, flexible,                            transoral; with biopsy, single or multiple Diagnosis Code(s):        --- Professional ---                           K22.8, Other specified diseases of esophagus                           K44.9, Diaphragmatic hernia without obstruction or                            gangrene                           K21.00, Gastro-esophageal reflux disease with                            esophagitis, without bleeding                           K29.70, Gastritis, unspecified, without bleeding                           R10.13, Epigastric pain                           R19.7, Diarrhea, unspecified  R11.0, Nausea CPT copyright 2019 American Medical Association. All rights reserved. The codes documented in this report are preliminary and upon coder review may  be revised to meet current compliance requirements. Elon Alas. Abbey Chatters, DO Laverne Abbey Chatters, DO 06/17/2020 11:56:29 AM This report has been signed electronically. Number of Addenda: 0

## 2020-06-17 NOTE — Transfer of Care (Signed)
Immediate Anesthesia Transfer of Care Note  Patient: Melanie Oliver  Procedure(s) Performed: COLONOSCOPY WITH PROPOFOL (N/A ) ESOPHAGOGASTRODUODENOSCOPY (EGD) WITH PROPOFOL (N/A ) BIOPSY POLYPECTOMY  Patient Location: PACU  Anesthesia Type:General  Level of Consciousness: awake, alert , oriented and patient cooperative  Airway & Oxygen Therapy: Patient Spontanous Breathing  Post-op Assessment: Report given to RN, Post -op Vital signs reviewed and stable and Patient moving all extremities X 4  Post vital signs: Reviewed and stable  Last Vitals:  Vitals Value Taken Time  BP    Temp 36.8 C 06/17/20 1212  Pulse    Resp 16 06/17/20 1212  SpO2 97 % 06/17/20 1212    Last Pain:  Vitals:   06/17/20 1212  TempSrc: Oral  PainSc: 0-No pain      Patients Stated Pain Goal: 7 (37/10/62 6948)  Complications: No complications documented.

## 2020-06-17 NOTE — Op Note (Signed)
[facility] Patient Name: Melanie Oliver Procedure Date: 06/17/2020 11:55 AM MRN: [id number] Date of Birth: 20-Sep-1960 Attending MD: Elon Alas. Abbey Chatters DO CSN: [id number] Age: 60 Admit Type: Outpatient Procedure:                Colonoscopy Indications:              Lower abdominal pain, Chronic diarrhea Providers:                Elon Alas. Abbey Chatters, DO, Charlsie Quest. Theda Sers RN, RN,                            Aram Candela Referring MD:              Medicines:                See the Anesthesia note for documentation of the                            administered medications Complications:            No immediate complications. Estimated Blood Loss:     Estimated blood loss was minimal. Procedure:                Pre-Anesthesia Assessment:                           - The anesthesia plan was to use monitored                            anesthesia care (MAC).                           After obtaining informed consent, the colonoscope                            was passed under direct vision. Throughout the                            procedure, the patient's blood pressure, pulse, and                            oxygen saturations were monitored continuously. The                            [id number]-[id number] ([id number]) scope was introduced through                            the anus and advanced to the the cecum, identified                            by appendiceal orifice and ileocecal valve. The                            colonoscopy was performed without difficulty. The                            patient tolerated the procedure well.  The quality                            of the bowel preparation was evaluated using the                            BBPS [facility] Bowel Preparation Scale) with scores                            of: Right Colon = 3, Transverse Colon = 3 and Left                            Colon = 3 (entire mucosa seen well with no residual                            staining, small  fragments of stool or opaque                            liquid). The total BBPS score equals 9. Scope In: 11:58:13 AM Scope Out: 68:11:57 PM Scope Withdrawal Time: 0 hours 6 minutes 50 seconds  Total Procedure Duration: 0 hours 8 minutes 43 seconds  Findings:      The perianal and digital rectal examinations were normal.      Non-bleeding internal hemorrhoids were found during endoscopy.      Multiple small-mouthed diverticula were found in the sigmoid colon.      A 2 mm polyp was found in the transverse colon. The polyp was sessile.       The polyp was removed with a cold biopsy forceps. Resection and       retrieval were complete.      Biopsies for histology were taken with a cold forceps from the ascending       colon, transverse colon and descending colon for evaluation of       microscopic colitis. Impression:               - Non-bleeding internal hemorrhoids.                           - Diverticulosis in the sigmoid colon.                           - One 2 mm polyp in the transverse colon, removed                            with a cold biopsy forceps. Resected and retrieved.                           - Biopsies were taken with a cold forceps from the                            ascending colon, transverse colon and descending                            colon for evaluation of microscopic colitis. Moderate Sedation:      Per  Anesthesia Care Recommendation:           - Patient has a contact number available for                            emergencies. The signs and symptoms of potential                            delayed complications were discussed with the                            patient. Return to normal activities tomorrow.                            Written discharge instructions were provided to the                            patient.                           - Resume previous diet.                           - Continue present medications.                           -  Await pathology results.                           - Repeat colonoscopy in 5 years for surveillance.                           - Return to GI clinic in 3 months. Procedure Code(s):        --- Professional ---                           [phone], Colonoscopy, flexible; with biopsy, single                            or multiple Diagnosis Code(s):        --- Professional ---                           K64.8, Other hemorrhoids                           K63.5, Polyp of colon                           R10.30, Lower abdominal pain, unspecified                           K52.9, Noninfective gastroenteritis and colitis,                            unspecified  K57.30, Diverticulosis of large intestine without                            perforation or abscess without bleeding CPT copyright 2019 American Medical Association. All rights reserved. The codes documented in this report are preliminary and upon coder review may  be revised to meet current compliance requirements. Elon Alas. Abbey Chatters, DO Talty Abbey Chatters, DO 06/17/2020 12:11:08 PM This report has been signed electronically. Number of Addenda: 0

## 2020-06-17 NOTE — Interval H&P Note (Signed)
History and Physical Interval Note:  06/17/2020 10:57 AM  Melanie Oliver  has presented today for surgery, with the diagnosis of diarrhea, rectal bleeding, gerd, lower abdominal pain.  The various methods of treatment have been discussed with the patient and family. After consideration of risks, benefits and other options for treatment, the patient has consented to  Procedure(s) with comments: COLONOSCOPY WITH PROPOFOL (N/A) - PM APPT ESOPHAGOGASTRODUODENOSCOPY (EGD) WITH PROPOFOL (N/A) as a surgical intervention.  The patient's history has been reviewed, patient examined, no change in status, stable for surgery.  I have reviewed the patient's chart and labs.  Questions were answered to the patient's satisfaction.     Eloise Harman

## 2020-06-20 ENCOUNTER — Encounter: Payer: Self-pay | Admitting: Internal Medicine

## 2020-06-20 LAB — SURGICAL PATHOLOGY

## 2020-06-22 ENCOUNTER — Encounter (HOSPITAL_COMMUNITY): Payer: Self-pay | Admitting: Internal Medicine

## 2020-06-27 NOTE — Progress Notes (Signed)
Dear Melanie Oliver,   Your biopsies from your abdomen showed inflammation but you are negative for H.Pylori. Continue on twice daily your Esomeprazole (Nexium). Avoid NSAID's.   Your Esophageal biopsies are negative.  Regarding your colonoscopy, the colon biopsies are negative. The polyp(s) removed were tubular adenoma(s). We will need to repeat your colonoscopy in 5 years as discussed by you and Dr. Abbey Chatters.   Follow-up as previously scheduled.   Thank you, Oleh Genin, CMA

## 2020-09-12 ENCOUNTER — Ambulatory Visit: Payer: BC Managed Care – PPO | Admitting: Gastroenterology

## 2020-09-21 ENCOUNTER — Ambulatory Visit: Payer: BC Managed Care – PPO | Admitting: Gastroenterology

## 2020-09-28 ENCOUNTER — Ambulatory Visit: Payer: BC Managed Care – PPO | Admitting: Gastroenterology

## 2020-09-28 ENCOUNTER — Encounter: Payer: Self-pay | Admitting: Gastroenterology

## 2020-09-28 NOTE — Progress Notes (Deleted)
      Primary Care Physician: Sharilyn Sites, MD  Primary Gastroenterologist:    No chief complaint on file.   HPI: Melanie Oliver is a 60 y.o. female here   EGD 05/2020: - Esophageal mucosal changes suspicious for eosinophilic esophagitis. Biopsied, benign - Medium-sized hiatal hernia. - LA Grade C reflux esophagitis with no bleeding. - Gastritis. Biopsied, reactive gastropathy, no H. pylori - Normal duodenal bulb, first portion of the duodenum and second portion of the duodenum.  Colonoscopy 05/2020: - non-bleeding internal hemorrhoids - diverticulosis - One 2 mm polyp in the transverse colon, removed with a cold biopsy forceps. Resected and retrieved.  Biopsy tubular adenoma. - Biopsies were taken with a cold forceps from the ascending colon, transverse colon and descending colon for evaluation of microscopic colitis.  Biopsies negative -Next colonoscopy in 5 years.   Current Outpatient Medications  Medication Sig Dispense Refill   cholecalciferol (VITAMIN D3) 25 MCG (1000 UNIT) tablet Take 1,000 Units by mouth daily.     esomeprazole (NEXIUM) 20 MG capsule Take 1 capsule (20 mg total) by mouth 2 (two) times daily before a meal. 60 capsule 5   No current facility-administered medications for this visit.    Allergies as of 09/28/2020 - Review Complete 06/17/2020  Allergen Reaction Noted   Codeine Nausea And Vomiting    Levofloxacin  05/19/2020   Sulfa antibiotics  05/19/2020    ROS:  General: Negative for anorexia, weight loss, fever, chills, fatigue, weakness. ENT: Negative for hoarseness, difficulty swallowing , nasal congestion. CV: Negative for chest pain, angina, palpitations, dyspnea on exertion, peripheral edema.  Respiratory: Negative for dyspnea at rest, dyspnea on exertion, cough, sputum, wheezing.  GI: See history of present illness. GU:  Negative for dysuria, hematuria, urinary incontinence, urinary frequency, nocturnal urination.  Endo: Negative  for unusual weight change.    Physical Examination:   LMP 06/25/2011   General: Well-nourished, well-developed in no acute distress.  Eyes: No icterus. Mouth: Oropharyngeal mucosa moist and pink , no lesions erythema or exudate. Lungs: Clear to auscultation bilaterally.  Heart: Regular rate and rhythm, no murmurs rubs or gallops.  Abdomen: Bowel sounds are normal, nontender, nondistended, no hepatosplenomegaly or masses, no abdominal bruits or hernia , no rebound or guarding.   Extremities: No lower extremity edema. No clubbing or deformities. Neuro: Alert and oriented x 4   Skin: Warm and dry, no jaundice.   Psych: Alert and cooperative, normal mood and affect.  Labs:  ***  Imaging Studies: No results found.   Assessment:     Plan:

## 2020-09-29 ENCOUNTER — Ambulatory Visit: Payer: BC Managed Care – PPO | Admitting: Internal Medicine

## 2020-09-29 ENCOUNTER — Other Ambulatory Visit: Payer: Self-pay

## 2020-09-29 ENCOUNTER — Other Ambulatory Visit: Payer: Self-pay | Admitting: *Deleted

## 2020-09-29 ENCOUNTER — Encounter: Payer: Self-pay | Admitting: Internal Medicine

## 2020-09-29 VITALS — BP 159/88 | HR 69 | Temp 97.7°F | Ht 61.0 in | Wt 216.4 lb

## 2020-09-29 DIAGNOSIS — K2100 Gastro-esophageal reflux disease with esophagitis, without bleeding: Secondary | ICD-10-CM

## 2020-09-29 DIAGNOSIS — R197 Diarrhea, unspecified: Secondary | ICD-10-CM | POA: Diagnosis not present

## 2020-09-29 DIAGNOSIS — K219 Gastro-esophageal reflux disease without esophagitis: Secondary | ICD-10-CM | POA: Insufficient documentation

## 2020-09-29 NOTE — Patient Instructions (Signed)
For your diarrhea, I will order stool studies to be performed at [facility].  Please collect stool during one of your episodes where you are having diarrhea.  They will not run the test if the stool is normal.  I am going to give you information on a low FODMAP diet which may pinpoint certain foods to avoid.  I also want you to keep a diary of foods you eat when these attacks happen.  If stool studies are negative, I would recommend taking Imodium as soon as you feel the episodes coming on.  We will call you with stool results.  Otherwise follow-up in 3 months  At [facility] we value your feedback. You may receive a survey about your visit today. Please share your experience as we strive to create trusting relationships with our patients to provide genuine, compassionate, quality care.  We appreciate your understanding and patience as we review any laboratory studies, imaging, and other diagnostic tests that are ordered as we care for you. Our office policy is 5 business days for review of these results, and any emergent or urgent results are addressed in a timely manner for your best interest. If you do not hear from our office in 1 week, please contact us.   We also encourage the use of MyChart, which contains your medical information for your review as well. If you are not enrolled in this feature, an access code is on this after visit summary for your convenience. Thank you for allowing [place] to be involved in your care.  It was great to see you today!  I hope you have a great rest of your summer!!    Elon Alas. Abbey Chatters, D.O. Gastroenterology and Hepatology [facility]

## 2020-09-29 NOTE — Progress Notes (Signed)
Referring Provider: Sharilyn Sites, MD Primary Care Physician:  Sharilyn Sites, MD Primary GI:  Dr. Abbey Chatters  Chief Complaint  Patient presents with   Diarrhea    Urgency, loose stools several times daily, but not all the time.     HPI:   Melanie Oliver is a 60 y.o. female who presents to clinic today for follow-up visit.  Has chronic GERD, EGD 06/17/2020 showed LA grade C esophagitis and gastritis.  Patient was started on generic Nexium and states her symptoms are vastly improved.  Biopsies negative for H. pylori.  Also had colonoscopy at the same time due to chronic diarrhea.  Had 1 tubular adenoma removed from her descending colon with 5-year recall.  Random colon biopsies negative for microscopic colitis.  Complaint for me today is ongoing diarrhea.  States every 2 to 3 weeks she will have 1 to 2 days of diarrhea, multiple times.  Feels it coming on and her whole day is ruined.  States symptoms primarily occur with cantaloupe, watermelon, sometimes dairy.  No melena hematochezia.  Is scared to take Imodium as she is concerned she might have an infection and this can make this worse.  Past Medical History:  Diagnosis Date   Chiari malformation type I ([place])    Complication of anesthesia    woke up with a migraine   Family history of anesthesia complication    aunt wakes up with a migraine   GERD (gastroesophageal reflux disease)    Headache(784.0)    Hypertension    Neuromuscular disorder ([place])    pinched nerve in neck   Other and unspecified general anesthetics causing adverse effect in therapeutic use    Has Chiari malformation- per neurologist "do not over extend neck"     Past Surgical History:  Procedure Laterality Date   BIOPSY  06/17/2020   Procedure: BIOPSY;  Surgeon: Eloise Harman, DO;  [facility];  Service: Endoscopy;;   COLONOSCOPY N/A 10/27/2013   Procedure: COLONOSCOPY;  Surgeon: Jamesetta So, MD;  Location: [facility];  Service:  Gastroenterology;  Laterality: N/A;   COLONOSCOPY WITH PROPOFOL N/A 06/17/2020   Loy Little: Nonbleeding internal hemorrhoids, diverticulosis, 2 mm tubular adenoma removed, random colon biopsies negative.  Next colonoscopy 5 years.   ESOPHAGOGASTRODUODENOSCOPY (EGD) WITH PROPOFOL N/A 06/17/2020   Nameer Summer: Medium sized hiatal hernia, LA grade C reflux esophagitis, reactive gastropathy, no H. pylori.  Esophageal mucosal changes suspicious for eosinophilic esophagitis but biopsies were benign.   MOUTH SURGERY     POLYPECTOMY  06/17/2020   Procedure: POLYPECTOMY;  Surgeon: Eloise Harman, DO;  [facility];  Service: Endoscopy;;   VAGINAL HYSTERECTOMY  08/27/2011   Procedure: HYSTERECTOMY VAGINAL;  Surgeon: Anastasio Auerbach, MD;  Location: [facility];  Service: Gynecology;  Laterality: N/A;    Current Outpatient Medications  Medication Sig Dispense Refill   esomeprazole (NEXIUM) 20 MG capsule Take 1 capsule (20 mg total) by mouth 2 (two) times daily before a meal. (Patient taking differently: Take 20 mg by mouth daily.) 60 capsule 5   cholecalciferol (VITAMIN D3) 25 MCG (1000 UNIT) tablet Take 1,000 Units by mouth daily. (Patient not taking: Reported on 09/29/2020)     No current facility-administered medications for this visit.    Allergies as of 09/29/2020 - Review Complete 09/29/2020  Allergen Reaction Noted   Codeine Nausea And Vomiting    Levofloxacin  05/19/2020   Sulfa antibiotics  05/19/2020    Family History  Problem Relation Age of Onset   Cancer Mother        brain tumor   Cancer Father        Non Hodgkins lymphoma   Alzheimer's disease Father    Hypertension Sister    Diabetes Sister    Diabetes Paternal Aunt    Diabetes Maternal Grandfather    Hypertension Paternal Grandmother    Heart disease Paternal Grandmother    Colon cancer Neg Hx     Social History   Socioeconomic History   Marital status: Married    Spouse name: Not on file   Number of children:  Not on file   Years of education: Not on file   Highest education level: Not on file  Occupational History   Not on file  Tobacco Use   Smoking status: Never   Smokeless tobacco: Never  Substance and Sexual Activity   Alcohol use: No   Drug use: No   Sexual activity: Yes    Birth control/protection: Surgical    Comment: Hyst  Other Topics Concern   Not on file  Social History Narrative   Not on file   Social Determinants of Health   Financial Resource Strain: Not on file  Food Insecurity: Not on file  Transportation Needs: Not on file  Physical Activity: Not on file  Stress: Not on file  Social Connections: Not on file    Subjective: Review of Systems  Constitutional:  Negative for chills and fever.  HENT:  Negative for congestion and hearing loss.   Eyes:  Negative for blurred vision and double vision.  Respiratory:  Negative for cough and shortness of breath.   Cardiovascular:  Negative for chest pain and palpitations.  Gastrointestinal:  Positive for diarrhea. Negative for abdominal pain, blood in stool, constipation, heartburn, melena and vomiting.  Genitourinary:  Negative for dysuria and urgency.  Musculoskeletal:  Negative for joint pain and myalgias.  Skin:  Negative for itching and rash.  Neurological:  Negative for dizziness and headaches.  Psychiatric/Behavioral:  Negative for depression. The patient is not nervous/anxious.     Objective: BP (!) 159/88   Pulse 69   Temp 97.7 F (36.5 C)   Ht '5\' 1"'$  (1.549 m)   Wt 216 lb 6.4 oz (98.2 kg)   LMP 06/25/2011   BMI 40.89 kg/m  Physical Exam Constitutional:      Appearance: Normal appearance.  HENT:     Head: Normocephalic and atraumatic.  Eyes:     Extraocular Movements: Extraocular movements intact.     Conjunctiva/sclera: Conjunctivae normal.  Cardiovascular:     Rate and Rhythm: Normal rate and regular rhythm.  Pulmonary:     Effort: Pulmonary effort is normal.     Breath sounds: Normal breath  sounds.  Abdominal:     General: Bowel sounds are normal.     Palpations: Abdomen is soft.  Musculoskeletal:        General: No swelling. Normal range of motion.     Cervical back: Normal range of motion and neck supple.  Skin:    General: Skin is warm and dry.     Coloration: Skin is not jaundiced.  Neurological:     General: No focal deficit present.     Mental Status: She is alert and oriented to person, place, and time.  Psychiatric:        Mood and Affect: Mood normal.        Behavior: Behavior normal.     Assessment: *  Diarrhea-chronic *LA grade C esophagitis *Acid reflux  Plan: Patient's acid reflux symptoms much improved on generic Nexium daily.  Denies any heartburn, reflux, dysphagia, odynophagia.  We will continue.  Diarrhea continues to be an issue.  Likely irritable bowel syndrome, diarrhea predominant.  Will order stool studies to rule out infectious etiologies.  If these are negative, I would recommend patient take Imodium when she feels her symptoms starting.  Can consider other therapy such as Viberzi if this does not work.  I will print off information regarding low FODMAP diet.   Follow-up in 3 months.  09/29/2020 3:57 PM   Disclaimer: This note was dictated with voice recognition software. Similar sounding words can inadvertently be transcribed and may not be corrected upon review.

## 2020-12-29 ENCOUNTER — Encounter: Payer: Self-pay | Admitting: Internal Medicine
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
## 2021-07-12 ENCOUNTER — Other Ambulatory Visit: Payer: Self-pay | Admitting: Internal Medicine

## 2021-07-14 ENCOUNTER — Telehealth: Payer: Self-pay | Admitting: Internal Medicine

## 2021-07-14 MED ORDER — ESOMEPRAZOLE MAGNESIUM 20 MG PO CPDR: 20.0000 mg | DELAYED_RELEASE_CAPSULE | Freq: Two times a day (BID) | ORAL | 11 refills | Status: DC

## 2021-07-14 NOTE — Telephone Encounter (Signed)
Nexium refilled 

## 2021-07-14 NOTE — Telephone Encounter (Signed)
Refill request for this pt came today for Esomeprazole DR 20 mg cap. Pt's last ov was 09/29/2020 and she was suppose to follow up in 3 months.

## 2023-01-03 ENCOUNTER — Other Ambulatory Visit: Payer: Self-pay | Admitting: Internal Medicine

## 2023-01-10 ENCOUNTER — Ambulatory Visit: Payer: BC Managed Care – PPO | Admitting: Internal Medicine

## 2023-01-10 ENCOUNTER — Encounter: Payer: Self-pay | Admitting: Internal Medicine

## 2023-01-10 VITALS — BP 149/86 | HR 68 | Temp 98.6°F | Ht 61.0 in | Wt 203.4 lb

## 2023-01-10 DIAGNOSIS — R1033 Periumbilical pain: Secondary | ICD-10-CM

## 2023-01-10 DIAGNOSIS — R109 Unspecified abdominal pain: Secondary | ICD-10-CM | POA: Diagnosis not present

## 2023-01-10 DIAGNOSIS — K219 Gastro-esophageal reflux disease without esophagitis: Secondary | ICD-10-CM | POA: Diagnosis not present

## 2023-01-10 DIAGNOSIS — R197 Diarrhea, unspecified: Secondary | ICD-10-CM | POA: Diagnosis not present

## 2023-01-10 DIAGNOSIS — K2100 Gastro-esophageal reflux disease with esophagitis, without bleeding: Secondary | ICD-10-CM

## 2023-01-10 DIAGNOSIS — E559 Vitamin D deficiency, unspecified: Secondary | ICD-10-CM

## 2023-01-10 MED ORDER — ESOMEPRAZOLE MAGNESIUM 20 MG PO CPDR: 20.0000 mg | DELAYED_RELEASE_CAPSULE | Freq: Two times a day (BID) | ORAL | 3 refills | Status: AC

## 2023-01-10 MED ORDER — DICYCLOMINE HCL 10 MG PO CAPS: 10.0000 mg | ORAL_CAPSULE | Freq: Two times a day (BID) | ORAL | 11 refills | Status: AC

## 2023-01-10 NOTE — Patient Instructions (Addendum)
Continue on Nexium for your chronic acid reflux.  I have refilled this today.  For your abdominal pain diarrhea, I will send in a new medication called dicyclomine to take twice daily.  I will check blood work today at [facility] including vitamin D levels as well as generic blood work including CBC and CMP.  Follow-up in 3 months.  It was very nice seeing you again today.  Dr. Marletta Lor

## 2023-01-10 NOTE — Progress Notes (Signed)
Referring Provider: Assunta Found, MD Primary Care Physician:  Assunta Found, MD Primary GI:  Dr. Marletta Lor  Chief Complaint  Patient presents with   Follow-up    Pt following up on diarrhea and refills    HPI:   Melanie Oliver is a 62 y.o. female who presents to clinic today for follow-up visit.  Has chronic GERD, EGD 06/17/2020 showed LA grade C esophagitis and gastritis.  Patient was started on generic Nexium and states her symptoms are vastly improved.  Biopsies negative for H. pylori.    Colonoscopy 06/17/20 due to chronic diarrhea.  Had 1 tubular adenoma removed from her descending colon with 5-year recall.  Random colon biopsies negative for microscopic colitis.  Chronic diarrhea continues to be an issue, occasional urgency after meals. Also notes associated worsening abdominal cramping type pain, primarily periumbilical. Notes increased stress at work making her symptoms worse.  Celiac testing, thyroid testing normal.  Also chronically on vitamin D for vitamin D for deficiency.  She states she has not had blood work in over a year.  Past Medical History:  Diagnosis Date   Chiari malformation type I ([facility])    Complication of anesthesia    woke up with a migraine   Family history of anesthesia complication    aunt wakes up with a migraine   GERD (gastroesophageal reflux disease)    Headache(784.0)    Hypertension    Neuromuscular disorder ([facility])    pinched nerve in neck   Other and unspecified general anesthetics causing adverse effect in therapeutic use    Has Chiari malformation- per neurologist "do not over extend neck"     Past Surgical History:  Procedure Laterality Date   BIOPSY  06/17/2020   Procedure: BIOPSY;  Surgeon: Lanelle Bal, DO;  [facility];  Service: Endoscopy;;   COLONOSCOPY N/A 10/27/2013   Procedure: COLONOSCOPY;  Surgeon: Dalia Heading, MD;  [facility];  Service: Gastroenterology;  Laterality: N/A;   COLONOSCOPY  WITH PROPOFOL N/A 06/17/2020   Raliegh Scobie: Nonbleeding internal hemorrhoids, diverticulosis, 2 mm tubular adenoma removed, random colon biopsies negative.  Next colonoscopy 5 years.   ESOPHAGOGASTRODUODENOSCOPY (EGD) WITH PROPOFOL N/A 06/17/2020   Nikisha Fleece: Medium sized hiatal hernia, LA grade C reflux esophagitis, reactive gastropathy, no H. pylori.  Esophageal mucosal changes suspicious for eosinophilic esophagitis but biopsies were benign.   MOUTH SURGERY     POLYPECTOMY  06/17/2020   Procedure: POLYPECTOMY;  Surgeon: Lanelle Bal, DO;  [facility];  Service: Endoscopy;;   VAGINAL HYSTERECTOMY  08/27/2011   Procedure: HYSTERECTOMY VAGINAL;  Surgeon: Dara Lords, MD;  Location: [facility];  Service: Gynecology;  Laterality: N/A;    Current Outpatient Medications  Medication Sig Dispense Refill   cholecalciferol (VITAMIN D3) 25 MCG (1000 UNIT) tablet Take 1,000 Units by mouth daily.     esomeprazole (NEXIUM) 20 MG capsule Take 1 capsule (20 mg total) by mouth 2 (two) times daily before a meal. 60 capsule 11   No current facility-administered medications for this visit.    Allergies as of 01/10/2023 - Review Complete 01/10/2023  Allergen Reaction Noted   Codeine Nausea And Vomiting    Levofloxacin  05/19/2020   Sulfa antibiotics  05/19/2020    Family History  Problem Relation Age of Onset   Cancer Mother        brain tumor   Cancer Father        Non Hodgkins lymphoma  Alzheimer's disease Father    Hypertension Sister    Diabetes Sister    Diabetes Paternal Aunt    Diabetes Maternal Grandfather    Hypertension Paternal Grandmother    Heart disease Paternal Grandmother    Colon cancer Neg Hx     Social History   Socioeconomic History   Marital status: Married    Spouse name: Not on file   Number of children: Not on file   Years of education: Not on file   Highest education level: Not on file  Occupational History   Not on file  Tobacco Use   Smoking  status: Never   Smokeless tobacco: Never  Substance and Sexual Activity   Alcohol use: No   Drug use: No   Sexual activity: Yes    Birth control/protection: Surgical    Comment: Hyst  Other Topics Concern   Not on file  Social History Narrative   Not on file   Social Determinants of Health   Financial Resource Strain: Not on file  Food Insecurity: Not on file  Transportation Needs: Not on file  Physical Activity: Not on file  Stress: Not on file  Social Connections: Not on file    Subjective: Review of Systems  Constitutional:  Negative for chills and fever.  HENT:  Negative for congestion and hearing loss.   Eyes:  Negative for blurred vision and double vision.  Respiratory:  Negative for cough and shortness of breath.   Cardiovascular:  Negative for chest pain and palpitations.  Gastrointestinal:  Positive for diarrhea. Negative for abdominal pain, blood in stool, constipation, heartburn, melena and vomiting.  Genitourinary:  Negative for dysuria and urgency.  Musculoskeletal:  Negative for joint pain and myalgias.  Skin:  Negative for itching and rash.  Neurological:  Negative for dizziness and headaches.  Psychiatric/Behavioral:  Negative for depression. The patient is not nervous/anxious.      Objective: BP (!) 149/86   Pulse 68   Temp 98.6 F (37 C)   Ht 5\' 1"  (1.549 m)   Wt 203 lb 6.4 oz (92.3 kg)   LMP 06/25/2011   BMI 38.43 kg/m  Physical Exam Constitutional:      Appearance: Normal appearance.  HENT:     Head: Normocephalic and atraumatic.  Eyes:     Extraocular Movements: Extraocular movements intact.     Conjunctiva/sclera: Conjunctivae normal.  Cardiovascular:     Rate and Rhythm: Normal rate and regular rhythm.  Pulmonary:     Effort: Pulmonary effort is normal.     Breath sounds: Normal breath sounds.  Abdominal:     General: Bowel sounds are normal.     Palpations: Abdomen is soft.  Musculoskeletal:        General: No swelling. Normal  range of motion.     Cervical back: Normal range of motion and neck supple.  Skin:    General: Skin is warm and dry.     Coloration: Skin is not jaundiced.  Neurological:     General: No focal deficit present.     Mental Status: She is alert and oriented to person, place, and time.  Psychiatric:        Mood and Affect: Mood normal.        Behavior: Behavior normal.      Assessment/Plan:  1.  Chronic GERD-well-controlled on esomeprazole 20 mg 1-2 times daily.  Will refill today.  2.  Abdominal pain/diarrhea-may have component of irritable bowel syndrome diarrhea predominant.  Trial of  dicyclomine 10 mg twice daily.  Continue Imodium as needed.  Check CBC, CMP.  3.  Vitamin D deficiency-continue oral vitamin D.  Will add on vitamin D level blood work as well.  Follow-up in 3 months    01/10/2023 2:07 PM   Disclaimer: This note was dictated with voice recognition software. Similar sounding words can inadvertently be transcribed and may not be corrected upon review.

## 2023-01-19 LAB — COMPREHENSIVE METABOLIC PANEL
ALT: 12 [IU]/L (ref 0–32)
AST: 18 [IU]/L (ref 0–40)
Albumin: 4.1 g/dL (ref 3.9–4.9)
Alkaline Phosphatase: 122 [IU]/L — ABNORMAL HIGH (ref 44–121)
BUN/Creatinine Ratio: 11 — ABNORMAL LOW (ref 12–28)
BUN: 10 mg/dL (ref 8–27)
Bilirubin Total: 1.6 mg/dL — ABNORMAL HIGH (ref 0.0–1.2)
CO2: 24 mmol/L (ref 20–29)
Calcium: 9.5 mg/dL (ref 8.7–10.3)
Chloride: 104 mmol/L (ref 96–106)
Creatinine, Ser: 0.94 mg/dL (ref 0.57–1.00)
Globulin, Total: 2.7 g/dL (ref 1.5–4.5)
Glucose: 96 mg/dL (ref 70–99)
Potassium: 4.3 mmol/L (ref 3.5–5.2)
Sodium: 143 mmol/L (ref 134–144)
Total Protein: 6.8 g/dL (ref 6.0–8.5)
eGFR: 69 mL/min/{1.73_m2} (ref >59)

## 2023-01-19 LAB — CBC
Hematocrit: 44.6 % (ref 34.0–46.6)
Hemoglobin: 14.5 g/dL (ref 11.1–15.9)
MCH: 29.1 pg (ref 26.6–33.0)
MCHC: 32.5 g/dL (ref 31.5–35.7)
MCV: 90 fL (ref 79–97)
Platelets: 320 10*3/uL (ref 150–450)
RBC: 4.98 x10E6/uL (ref 3.77–5.28)
RDW: 12.4 % (ref 11.7–15.4)
WBC: 6.3 10*3/uL (ref 3.4–10.8)

## 2023-01-19 LAB — VITAMIN D 25 HYDROXY (VIT D DEFICIENCY, FRACTURES): Vit D, 25-Hydroxy: 38.9 ng/mL (ref 30.0–100.0)

## 2023-03-28 ENCOUNTER — Encounter: Payer: Self-pay | Admitting: Internal Medicine

## 2023-05-27 ENCOUNTER — Other Ambulatory Visit (HOSPITAL_COMMUNITY): Payer: Self-pay | Admitting: Family Medicine

## 2023-05-27 ENCOUNTER — Ambulatory Visit (HOSPITAL_COMMUNITY)
Admission: RE | Admit: 2023-05-27 | Discharge: 2023-05-27 | Disposition: A | Discharge status: Home / Self Care | Source: Ambulatory Visit | Attending: Family Medicine | Admitting: Family Medicine

## 2023-05-27 DIAGNOSIS — R109 Unspecified abdominal pain: Secondary | ICD-10-CM | POA: Insufficient documentation

## 2023-05-29 ENCOUNTER — Encounter: Payer: Self-pay | Admitting: Urology

## 2023-05-29 ENCOUNTER — Ambulatory Visit (HOSPITAL_COMMUNITY)
Admission: RE | Admit: 2023-05-29 | Discharge: 2023-05-29 | Disposition: A | Discharge status: Home / Self Care | Source: Ambulatory Visit | Attending: Urology | Admitting: Urology

## 2023-05-29 ENCOUNTER — Ambulatory Visit: Admitting: Urology

## 2023-05-29 VITALS — BP 137/86 | HR 80

## 2023-05-29 DIAGNOSIS — N1330 Unspecified hydronephrosis: Secondary | ICD-10-CM | POA: Diagnosis present

## 2023-05-29 DIAGNOSIS — N200 Calculus of kidney: Secondary | ICD-10-CM | POA: Diagnosis present

## 2023-05-29 DIAGNOSIS — S32040A Wedge compression fracture of fourth lumbar vertebra, initial encounter for closed fracture: Secondary | ICD-10-CM | POA: Insufficient documentation

## 2023-05-29 DIAGNOSIS — R109 Unspecified abdominal pain: Secondary | ICD-10-CM

## 2023-05-29 DIAGNOSIS — N281 Cyst of kidney, acquired: Secondary | ICD-10-CM | POA: Diagnosis not present

## 2023-05-29 DIAGNOSIS — N201 Calculus of ureter: Secondary | ICD-10-CM | POA: Diagnosis present

## 2023-05-29 NOTE — Progress Notes (Signed)
 Name: Melanie Oliver DOB: January 19, 1961 MRN: [id number]  History of Present Illness: Melanie Oliver is a 63 y.o. female who presents today as a new patient at [facility]. All available relevant medical records have been reviewed.   She reports concern of kidney stone(s).  She denies prior history of kidney stones.  Recent history: > 04/26/2023:  - UA showed trace leukocytes and large blood, no nitrites.  - Macrobid prescribed for suspected UTI.  - Urine culture sent; result not included in faxed records.  > 05/27/2023:  - Seen by PCP for intermittent left flank radiating to lower abdomen along with increased urinary urgency / frequency. CT showed a 9 mm left UPJ stone with mild left hydronephrosis and a small left renal upper pole cyst.    Today: She denies passing the stone.  She reports intermittent left flank pain since December 2024. The pain is severe at times and is now radiating to LLQ of abdomen. She denies fevers, nausea, or vomiting.  She reports increased urinary urgency and frequency. Denies dysuria, gross hematuria, straining to void, or sensations of incomplete emptying.   Medications: Current Outpatient Medications  Medication Sig Dispense Refill   cholecalciferol (VITAMIN D3) 25 MCG (1000 UNIT) tablet Take 1,000 Units by mouth daily.     dicyclomine (BENTYL) 10 MG capsule Take 1 capsule (10 mg total) by mouth 2 (two) times daily. 60 capsule 11   esomeprazole (NEXIUM) 20 MG capsule Take 1 capsule (20 mg total) by mouth 2 (two) times daily before a meal. 180 capsule 3   No current facility-administered medications for this visit.    Allergies: Allergies  Allergen Reactions   Codeine Nausea And Vomiting   Levofloxacin     Back spasms   Sulfa Antibiotics     Past Medical History:  Diagnosis Date   Chiari malformation type I (HCC)    Complication of anesthesia    woke up with a migraine   Family history of anesthesia complication     aunt wakes up with a migraine   GERD (gastroesophageal reflux disease)    Headache(784.0)    Hypertension    Neuromuscular disorder (HCC)    pinched nerve in neck   Other and unspecified general anesthetics causing adverse effect in therapeutic use    Has Chiari malformation- per neurologist "do not over extend neck"    Past Surgical History:  Procedure Laterality Date   BIOPSY  06/17/2020   Procedure: BIOPSY;  Surgeon: Lanelle Bal, DO;  [facility]: [facility];  Service: Endoscopy;;   COLONOSCOPY N/A 10/27/2013   Procedure: COLONOSCOPY;  Surgeon: Dalia Heading, MD;  [facility]: [facility];  Service: Gastroenterology;  Laterality: N/A;   COLONOSCOPY WITH PROPOFOL N/A 06/17/2020   Carver: Nonbleeding internal hemorrhoids, diverticulosis, 2 mm tubular adenoma removed, random colon biopsies negative.  Next colonoscopy 5 years.   ESOPHAGOGASTRODUODENOSCOPY (EGD) WITH PROPOFOL N/A 06/17/2020   Carver: Medium sized hiatal hernia, LA grade C reflux esophagitis, reactive gastropathy, no H. pylori.  Esophageal mucosal changes suspicious for eosinophilic esophagitis but biopsies were benign.   MOUTH SURGERY     POLYPECTOMY  06/17/2020   Procedure: POLYPECTOMY;  Surgeon: Lanelle Bal, DO;  [facility]: [facility];  Service: Endoscopy;;   VAGINAL HYSTERECTOMY  08/27/2011   Procedure: HYSTERECTOMY VAGINAL;  Surgeon: Dara Lords, MD;  Location: [facility];  Service: Gynecology;  Laterality: N/A;   Family History  Problem Relation Age of Onset   Cancer Mother  brain tumor   Cancer Father        Non Hodgkins lymphoma   Alzheimer's disease Father    Hypertension Sister    Diabetes Sister    Diabetes Paternal Aunt    Diabetes Maternal Grandfather    Hypertension Paternal Grandmother    Heart disease Paternal Grandmother    Colon cancer Neg Hx    Social History   Socioeconomic History   Marital status: Married    Spouse name: Not on file   Number of children: Not on  file   Years of education: Not on file   Highest education level: Not on file  Occupational History   Not on file  Tobacco Use   Smoking status: Never   Smokeless tobacco: Never  Substance and Sexual Activity   Alcohol use: No   Drug use: No   Sexual activity: Yes    Birth control/protection: Surgical    Comment: Hyst  Other Topics Concern   Not on file  Social History Narrative   Not on file   Social Drivers of Health   Financial Resource Strain: Not on file  Food Insecurity: Not on file  Transportation Needs: Not on file  Physical Activity: Not on file  Stress: Not on file  Social Connections: Not on file  Intimate Partner Violence: Not on file   SUBJECTIVE  Review of Systems Constitutional: Patient denies any unintentional weight loss or change in strength lntegumentary: Patient denies any rashes or pruritus Cardiovascular: Patient denies chest pain or syncope Respiratory: Patient denies shortness of breath Gastrointestinal: As per HPI Musculoskeletal: Patient denies muscle cramps or weakness Neurologic: Patient denies convulsions or seizures Allergic/Immunologic: Patient denies recent allergic reaction(s) Hematologic/Lymphatic: Patient denies bleeding tendencies Endocrine: Patient denies heat/cold intolerance  GU: As per HPI.  OBJECTIVE Vitals:   05/29/23 1406  BP: 137/86  Pulse: 80   There is no height or weight on file to calculate BMI.  Physical Examination Constitutional: No obvious distress; patient is non-toxic appearing  Cardiovascular: No visible lower extremity edema.  Respiratory: The patient does not have audible wheezing/stridor; respirations do not appear labored  Gastrointestinal: Abdomen non-distended Musculoskeletal: Normal ROM of UEs  Skin: No obvious rashes/open sores  Neurologic: CN 2-12 grossly intact Psychiatric: Answered questions appropriately with normal affect  Hematologic/Lymphatic/Immunologic: No obvious bruises or sites of  spontaneous bleeding  Urine microscopy: negative   ASSESSMENT Kidney stones - Plan: Urinalysis, Routine w reflex microscopic, DG Abd 1 View, Ambulatory Referral For Surgery Scheduling  Left ureteral stone - Plan: Urinalysis, Routine w reflex microscopic, DG Abd 1 View, Ambulatory Referral For Surgery Scheduling  Hydronephrosis of left kidney - Plan: Urinalysis, Routine w reflex microscopic, DG Abd 1 View, Ambulatory Referral For Surgery Scheduling  Left flank pain - Plan: Urinalysis, Routine w reflex microscopic, DG Abd 1 View, Ambulatory Referral For Surgery Scheduling  Acquired renal cyst of left kidney - Plan: Urinalysis, Routine w reflex microscopic  For acute GU stone symptoms we agreed to proceed with: - For pain management, we discussed the use of opioids versus OTC analgesics. She has Tramadol from her back specialist to take PRN for severe pain.   We discussed the various treatment options including extracorporeal shock wave lithotripsy (ESWL), ureteroscopic stone manipulation (URS), or percutaneous nephrolithotomy (PCNL). We discussed possible risks and benefits of intervention including but not limited to: including pain, infection, sepsis, UTI, ureter perforation, need for stenting, post-op ureteral stricture, hematuria.   Will get KUB and plan for left ESWL procedure;  plan confirmed with Dr. Ronne Binning.   She was advised to contact urology provider or go to the ER if She develops fever >101F, uncontrollable pain, or other significantly concerning symptoms prior to next office visit.  She verbalized understanding and agreement. All questions were answered.   PLAN Advised the following: KUB today. Analgesics PRN for pain. Return for surgery.  Orders Placed This Encounter  Procedures   DG Abd 1 View    Standing Status:   Future    Expected Date:   05/29/2023    Expiration Date:   05/28/2024    Reason for Exam (SYMPTOM  OR DIAGNOSIS REQUIRED):   kidney stone    Preferred  imaging location?:   [facility]   Urinalysis, Routine w reflex microscopic   Ambulatory Referral For Surgery Scheduling    Referral Priority:   Routine    Referral Type:   Consultation    Referred to Provider:   Malen Gauze, MD    Number of Visits Requested:   1    It has been explained that the patient is to follow regularly with their PCP in addition to all other providers involved in their care and to follow instructions provided by these respective offices. Patient advised to contact urology clinic if any urologic-pertaining questions, concerns, new symptoms or problems arise in the interim period.  There are no Patient Instructions on file for this visit.  Electronically signed by: Donnita Falls, MSN, FNP-C, CUNP 05/29/2023 2:25 PM

## 2023-05-29 NOTE — H&P (View-Only) (Signed)
 Name: Melanie Oliver DOB: January 19, 1961 MRN: [id number]  History of Present Illness: Melanie Oliver is a 63 y.o. female who presents today as a new patient at [facility]. All available relevant medical records have been reviewed.   She reports concern of kidney stone(s).  She denies prior history of kidney stones.  Recent history: > 04/26/2023:  - UA showed trace leukocytes and large blood, no nitrites.  - Macrobid prescribed for suspected UTI.  - Urine culture sent; result not included in faxed records.  > 05/27/2023:  - Seen by PCP for intermittent left flank radiating to lower abdomen along with increased urinary urgency / frequency. CT showed a 9 mm left UPJ stone with mild left hydronephrosis and a small left renal upper pole cyst.    Today: She denies passing the stone.  She reports intermittent left flank pain since December 2024. The pain is severe at times and is now radiating to LLQ of abdomen. She denies fevers, nausea, or vomiting.  She reports increased urinary urgency and frequency. Denies dysuria, gross hematuria, straining to void, or sensations of incomplete emptying.   Medications: Current Outpatient Medications  Medication Sig Dispense Refill   cholecalciferol (VITAMIN D3) 25 MCG (1000 UNIT) tablet Take 1,000 Units by mouth daily.     dicyclomine (BENTYL) 10 MG capsule Take 1 capsule (10 mg total) by mouth 2 (two) times daily. 60 capsule 11   esomeprazole (NEXIUM) 20 MG capsule Take 1 capsule (20 mg total) by mouth 2 (two) times daily before a meal. 180 capsule 3   No current facility-administered medications for this visit.    Allergies: Allergies  Allergen Reactions   Codeine Nausea And Vomiting   Levofloxacin     Back spasms   Sulfa Antibiotics     Past Medical History:  Diagnosis Date   Chiari malformation type I (HCC)    Complication of anesthesia    woke up with a migraine   Family history of anesthesia complication     aunt wakes up with a migraine   GERD (gastroesophageal reflux disease)    Headache(784.0)    Hypertension    Neuromuscular disorder (HCC)    pinched nerve in neck   Other and unspecified general anesthetics causing adverse effect in therapeutic use    Has Chiari malformation- per neurologist "do not over extend neck"    Past Surgical History:  Procedure Laterality Date   BIOPSY  06/17/2020   Procedure: BIOPSY;  Surgeon: Lanelle Bal, DO;  [facility];  Service: Endoscopy;;   COLONOSCOPY N/A 10/27/2013   Procedure: COLONOSCOPY;  Surgeon: Dalia Heading, MD;  [facility];  Service: Gastroenterology;  Laterality: N/A;   COLONOSCOPY WITH PROPOFOL N/A 06/17/2020   Carver: Nonbleeding internal hemorrhoids, diverticulosis, 2 mm tubular adenoma removed, random colon biopsies negative.  Next colonoscopy 5 years.   ESOPHAGOGASTRODUODENOSCOPY (EGD) WITH PROPOFOL N/A 06/17/2020   Carver: Medium sized hiatal hernia, LA grade C reflux esophagitis, reactive gastropathy, no H. pylori.  Esophageal mucosal changes suspicious for eosinophilic esophagitis but biopsies were benign.   MOUTH SURGERY     POLYPECTOMY  06/17/2020   Procedure: POLYPECTOMY;  Surgeon: Lanelle Bal, DO;  [facility]: [facility];  Service: Endoscopy;;   VAGINAL HYSTERECTOMY  08/27/2011   Procedure: HYSTERECTOMY VAGINAL;  Surgeon: Dara Lords, MD;  Location: [facility];  Service: Gynecology;  Laterality: N/A;   Family History  Problem Relation Age of Onset   Cancer Mother  brain tumor   Cancer Father        Non Hodgkins lymphoma   Alzheimer's disease Father    Hypertension Sister    Diabetes Sister    Diabetes Paternal Aunt    Diabetes Maternal Grandfather    Hypertension Paternal Grandmother    Heart disease Paternal Grandmother    Colon cancer Neg Hx    Social History   Socioeconomic History   Marital status: Married    Spouse name: Not on file   Number of children: Not on  file   Years of education: Not on file   Highest education level: Not on file  Occupational History   Not on file  Tobacco Use   Smoking status: Never   Smokeless tobacco: Never  Substance and Sexual Activity   Alcohol use: No   Drug use: No   Sexual activity: Yes    Birth control/protection: Surgical    Comment: Hyst  Other Topics Concern   Not on file  Social History Narrative   Not on file   Social Drivers of Health   Financial Resource Strain: Not on file  Food Insecurity: Not on file  Transportation Needs: Not on file  Physical Activity: Not on file  Stress: Not on file  Social Connections: Not on file  Intimate Partner Violence: Not on file   SUBJECTIVE  Review of Systems Constitutional: Patient denies any unintentional weight loss or change in strength lntegumentary: Patient denies any rashes or pruritus Cardiovascular: Patient denies chest pain or syncope Respiratory: Patient denies shortness of breath Gastrointestinal: As per HPI Musculoskeletal: Patient denies muscle cramps or weakness Neurologic: Patient denies convulsions or seizures Allergic/Immunologic: Patient denies recent allergic reaction(s) Hematologic/Lymphatic: Patient denies bleeding tendencies Endocrine: Patient denies heat/cold intolerance  GU: As per HPI.  OBJECTIVE Vitals:   05/29/23 1406  BP: 137/86  Pulse: 80   There is no height or weight on file to calculate BMI.  Physical Examination Constitutional: No obvious distress; patient is non-toxic appearing  Cardiovascular: No visible lower extremity edema.  Respiratory: The patient does not have audible wheezing/stridor; respirations do not appear labored  Gastrointestinal: Abdomen non-distended Musculoskeletal: Normal ROM of UEs  Skin: No obvious rashes/open sores  Neurologic: CN 2-12 grossly intact Psychiatric: Answered questions appropriately with normal affect  Hematologic/Lymphatic/Immunologic: No obvious bruises or sites of  spontaneous bleeding  Urine microscopy: negative   ASSESSMENT Kidney stones - Plan: Urinalysis, Routine w reflex microscopic, DG Abd 1 View, Ambulatory Referral For Surgery Scheduling  Left ureteral stone - Plan: Urinalysis, Routine w reflex microscopic, DG Abd 1 View, Ambulatory Referral For Surgery Scheduling  Hydronephrosis of left kidney - Plan: Urinalysis, Routine w reflex microscopic, DG Abd 1 View, Ambulatory Referral For Surgery Scheduling  Left flank pain - Plan: Urinalysis, Routine w reflex microscopic, DG Abd 1 View, Ambulatory Referral For Surgery Scheduling  Acquired renal cyst of left kidney - Plan: Urinalysis, Routine w reflex microscopic  For acute GU stone symptoms we agreed to proceed with: - For pain management, we discussed the use of opioids versus OTC analgesics. She has Tramadol from her back specialist to take PRN for severe pain.   We discussed the various treatment options including extracorporeal shock wave lithotripsy (ESWL), ureteroscopic stone manipulation (URS), or percutaneous nephrolithotomy (PCNL). We discussed possible risks and benefits of intervention including but not limited to: including pain, infection, sepsis, UTI, ureter perforation, need for stenting, post-op ureteral stricture, hematuria.   Will get KUB and plan for left ESWL procedure;  plan confirmed with Dr. Ronne Binning.   She was advised to contact urology provider or go to the ER if She develops fever >101F, uncontrollable pain, or other significantly concerning symptoms prior to next office visit.  She verbalized understanding and agreement. All questions were answered.   PLAN Advised the following: KUB today. Analgesics PRN for pain. Return for surgery.  Orders Placed This Encounter  Procedures   DG Abd 1 View    Standing Status:   Future    Expected Date:   05/29/2023    Expiration Date:   05/28/2024    Reason for Exam (SYMPTOM  OR DIAGNOSIS REQUIRED):   kidney stone    Preferred  imaging location?:   [facility]   Urinalysis, Routine w reflex microscopic   Ambulatory Referral For Surgery Scheduling    Referral Priority:   Routine    Referral Type:   Consultation    Referred to Provider:   Malen Gauze, MD    Number of Visits Requested:   1    It has been explained that the patient is to follow regularly with their PCP in addition to all other providers involved in their care and to follow instructions provided by these respective offices. Patient advised to contact urology clinic if any urologic-pertaining questions, concerns, new symptoms or problems arise in the interim period.  There are no Patient Instructions on file for this visit.  Electronically signed by: Donnita Falls, MSN, FNP-C, CUNP 05/29/2023 2:25 PM

## 2023-05-30 LAB — MICROSCOPIC EXAMINATION
Bacteria, UA: NONE SEEN
WBC, UA: NONE SEEN /HPF (ref 0–5)

## 2023-05-30 LAB — URINALYSIS, ROUTINE W REFLEX MICROSCOPIC
Bilirubin, UA: NEGATIVE
Glucose, UA: NEGATIVE
Ketones, UA: NEGATIVE
Leukocytes,UA: NEGATIVE
Nitrite, UA: NEGATIVE
Protein,UA: NEGATIVE
Specific Gravity, UA: 1.01 (ref 1.005–1.030)
Urobilinogen, Ur: 0.2 mg/dL (ref 0.2–1.0)
pH, UA: 6.5 (ref 5.0–7.5)

## 2023-05-31 ENCOUNTER — Other Ambulatory Visit: Payer: Self-pay

## 2023-05-31 DIAGNOSIS — N200 Calculus of kidney: Secondary | ICD-10-CM

## 2023-06-03 ENCOUNTER — Encounter (HOSPITAL_COMMUNITY)
Admission: RE | Admit: 2023-06-03 | Discharge: 2023-06-03 | Disposition: A | Discharge status: Home / Self Care | Source: Ambulatory Visit | Attending: Urology | Admitting: Urology

## 2023-06-04 ENCOUNTER — Ambulatory Visit (HOSPITAL_COMMUNITY)
Admission: RE | Admit: 2023-06-04 | Discharge: 2023-06-04 | Disposition: A | Discharge status: Home / Self Care | Attending: Urology | Admitting: Urology

## 2023-06-04 ENCOUNTER — Ambulatory Visit (HOSPITAL_COMMUNITY)

## 2023-06-04 ENCOUNTER — Encounter (HOSPITAL_COMMUNITY)
Admission: RE | Disposition: A | Discharge status: Home / Self Care | Payer: Self-pay | Source: Home / Self Care | Attending: Urology

## 2023-06-04 ENCOUNTER — Telehealth: Payer: Self-pay

## 2023-06-04 DIAGNOSIS — N200 Calculus of kidney: Secondary | ICD-10-CM | POA: Diagnosis present

## 2023-06-04 DIAGNOSIS — I10 Essential (primary) hypertension: Secondary | ICD-10-CM | POA: Insufficient documentation

## 2023-06-04 DIAGNOSIS — K219 Gastro-esophageal reflux disease without esophagitis: Secondary | ICD-10-CM | POA: Insufficient documentation

## 2023-06-04 DIAGNOSIS — N132 Hydronephrosis with renal and ureteral calculous obstruction: Secondary | ICD-10-CM | POA: Diagnosis not present

## 2023-06-04 SURGERY — LITHOTRIPSY, ESWL
Anesthesia: LOCAL | Laterality: Left

## 2023-06-04 MED ORDER — DIAZEPAM 5 MG PO TABS: 10.0000 mg | ORAL_TABLET | Freq: Once | ORAL | Status: AC

## 2023-06-04 MED ORDER — OXYCODONE-ACETAMINOPHEN 5-325 MG PO TABS: 1.0000 | ORAL_TABLET | ORAL | 0 refills | Status: DC | PRN

## 2023-06-04 MED ORDER — SODIUM CHLORIDE 0.9 % IV SOLN: Freq: Once | INTRAVENOUS | Status: AC

## 2023-06-04 MED ORDER — DIPHENHYDRAMINE HCL 25 MG PO CAPS
25.0000 mg | ORAL_CAPSULE | ORAL | Status: AC
  Administered 2023-06-04: 25 mg via ORAL
  Filled 2023-06-04: qty 1

## 2023-06-04 MED ORDER — DIAZEPAM 5 MG PO TABS
ORAL_TABLET | ORAL | Status: AC
  Administered 2023-06-04: 10 mg via ORAL
  Filled 2023-06-04: qty 2

## 2023-06-04 MED ORDER — ONDANSETRON HCL 4 MG PO TABS: 4.0000 mg | ORAL_TABLET | Freq: Every day | ORAL | 1 refills | Status: AC | PRN

## 2023-06-04 MED ORDER — TAMSULOSIN HCL 0.4 MG PO CAPS: 0.4000 mg | ORAL_CAPSULE | Freq: Every day | ORAL | 0 refills | Status: AC

## 2023-06-04 NOTE — Telephone Encounter (Signed)
 Medication prior authorization request received.  Completed PA request through cover my meds for drug oxycodone acetaminophen . KEY:  [id number]  Approved: Pending

## 2023-06-04 NOTE — Interval H&P Note (Signed)
 History and Physical Interval Note:  06/04/2023 8:37 AM  Melanie Oliver  has presented today for surgery, with the diagnosis of Left UPJ stone.  The various methods of treatment have been discussed with the patient and family. After consideration of risks, benefits and other options for treatment, the patient has consented to  Procedure(s): LITHOTRIPSY, ESWL (Left) as a surgical intervention.  The patient's history has been reviewed, patient examined, no change in status, stable for surgery.  I have reviewed the patient's chart and labs.  Questions were answered to the patient's satisfaction.     Wilkie Aye

## 2023-06-05 ENCOUNTER — Other Ambulatory Visit: Payer: Self-pay | Admitting: Urology

## 2023-06-05 ENCOUNTER — Encounter (HOSPITAL_COMMUNITY): Payer: Self-pay | Admitting: Urology

## 2023-06-05 ENCOUNTER — Telehealth: Payer: Self-pay

## 2023-06-05 MED ORDER — HYDROCODONE-ACETAMINOPHEN 5-325 MG PO TABS: 1.0000 | ORAL_TABLET | Freq: Four times a day (QID) | ORAL | 0 refills | Status: AC | PRN

## 2023-06-05 NOTE — Telephone Encounter (Signed)
 Patient called in today and state's her percocet is making her sleep a lot and would like a change in medication that is not going to make her sleep and nauseas.  Patient state's the Zofran is not helping her with her nauseas.  Patient is aware a message will be sent to the MD.

## 2023-06-10 ENCOUNTER — Telehealth: Payer: Self-pay

## 2023-06-10 ENCOUNTER — Encounter (HOSPITAL_COMMUNITY): Payer: Self-pay | Admitting: Urology

## 2023-06-10 NOTE — Telephone Encounter (Signed)
 Medication prior authorization request received.  Completed PA request through cover my meds for drug oxycodone. KEY: [id number]  Approved: Pending

## 2023-06-18 ENCOUNTER — Ambulatory Visit (HOSPITAL_COMMUNITY)
Admission: RE | Admit: 2023-06-18 | Discharge: 2023-06-18 | Disposition: A | Discharge status: Home / Self Care | Source: Ambulatory Visit | Attending: Urology | Admitting: Urology

## 2023-06-18 DIAGNOSIS — N200 Calculus of kidney: Secondary | ICD-10-CM | POA: Insufficient documentation

## 2023-06-19 ENCOUNTER — Ambulatory Visit (INDEPENDENT_AMBULATORY_CARE_PROVIDER_SITE_OTHER): Admitting: Urology

## 2023-06-19 ENCOUNTER — Encounter: Payer: Self-pay | Admitting: Urology

## 2023-06-19 VITALS — BP 174/96 | HR 76

## 2023-06-19 DIAGNOSIS — Z87442 Personal history of urinary calculi: Secondary | ICD-10-CM

## 2023-06-19 DIAGNOSIS — Z09 Encounter for follow-up examination after completed treatment for conditions other than malignant neoplasm: Secondary | ICD-10-CM

## 2023-06-19 DIAGNOSIS — N200 Calculus of kidney: Secondary | ICD-10-CM

## 2023-06-19 LAB — URINALYSIS, ROUTINE W REFLEX MICROSCOPIC
Bilirubin, UA: NEGATIVE
Glucose, UA: NEGATIVE
Ketones, UA: NEGATIVE
Leukocytes,UA: NEGATIVE
Nitrite, UA: NEGATIVE
Protein,UA: NEGATIVE
RBC, UA: NEGATIVE
Specific Gravity, UA: <1.005 — ABNORMAL LOW (ref 1.005–1.030)
Urobilinogen, Ur: 0.2 mg/dL (ref 0.2–1.0)
pH, UA: 6 (ref 5.0–7.5)

## 2023-06-19 NOTE — Progress Notes (Signed)
 06/19/2023 12:03 PM   Melanie Oliver [birth year] [id number]  Referring provider: Minus Amel, MD [address] [place],  [place] [address]  Followup nephrolithiasis   HPI: Melanie Oliver is a 63yo here for followup for nephrolithiasis. She dd well after ESWl and passed numerous fragments. KUB from today shows no calculi. No flank pain. She drinks 1-2 sundrops daily and minimal water .    PMH: Past Medical History:  Diagnosis Date   Chiari malformation type I (HCC)    Complication of anesthesia    woke up with a migraine   Family history of anesthesia complication    aunt wakes up with a migraine   GERD (gastroesophageal reflux disease)    Headache(784.0)    Hypertension    Neuromuscular disorder (HCC)    pinched nerve in neck   Other and unspecified general anesthetics causing adverse effect in therapeutic use    Has Chiari malformation- per neurologist "do not over extend neck"     Surgical History: Past Surgical History:  Procedure Laterality Date   BIOPSY  06/17/2020   Procedure: BIOPSY;  Surgeon: Vinetta Greening, DO;  [facility];  Service: Endoscopy;;   COLONOSCOPY N/A 10/27/2013   Procedure: COLONOSCOPY;  Surgeon: Beau Bound, MD;  [facility];  Service: Gastroenterology;  Laterality: N/A;   COLONOSCOPY WITH PROPOFOL  N/A 06/17/2020   Carver: Nonbleeding internal hemorrhoids, diverticulosis, 2 mm tubular adenoma removed, random colon biopsies negative.  Next colonoscopy 5 years.   ESOPHAGOGASTRODUODENOSCOPY (EGD) WITH PROPOFOL  N/A 06/17/2020   Carver: Medium sized hiatal hernia, LA grade C reflux esophagitis, reactive gastropathy, no H. pylori.  Esophageal mucosal changes suspicious for eosinophilic esophagitis but biopsies were benign.   EXTRACORPOREAL SHOCK WAVE LITHOTRIPSY Left 06/04/2023   Procedure: LITHOTRIPSY, ESWL;  Surgeon: Marco Severs, MD;  Location: [facility];  Service: Urology;  Laterality: Left;   MOUTH SURGERY      POLYPECTOMY  06/17/2020   Procedure: POLYPECTOMY;  Surgeon: Vinetta Greening, DO;  [facility];  Service: Endoscopy;;   VAGINAL HYSTERECTOMY  08/27/2011   Procedure: HYSTERECTOMY VAGINAL;  Surgeon: Lacretia Piccolo, MD;  Location: [facility];  Service: Gynecology;  Laterality: N/A;    Home Medications:  Allergies as of 06/19/2023       Reactions   Codeine Nausea And Vomiting   Levofloxacin    Back spasms   Sulfa Antibiotics         Medication List        Accurate as of June 19, 2023 12:03 PM. If you have any questions, ask your nurse or doctor.          cholecalciferol 25 MCG (1000 UNIT) tablet Commonly known as: VITAMIN D3 Take 1,000 Units by mouth daily.   dicyclomine  10 MG capsule Commonly known as: BENTYL  Take 1 capsule (10 mg total) by mouth 2 (two) times daily.   esomeprazole  20 MG capsule Commonly known as: NEXIUM  Take 1 capsule (20 mg total) by mouth 2 (two) times daily before a meal.   HYDROcodone -acetaminophen  5-325 MG tablet Commonly known as: NORCO/VICODIN Take 1 tablet by mouth every 6 (six) hours as needed for moderate pain (pain score 4-6) or severe pain (pain score 7-10).   ondansetron  4 MG tablet Commonly known as: Zofran  Take 1 tablet (4 mg total) by mouth daily as needed for nausea or vomiting.   oxyCODONE -acetaminophen  5-325 MG tablet Commonly known as: Percocet Take 1 tablet by mouth every 4 (four) hours as  needed.   tamsulosin  0.4 MG Caps capsule Commonly known as: Flomax  Take 1 capsule (0.4 mg total) by mouth daily after supper.        Allergies:  Allergies  Allergen Reactions   Codeine Nausea And Vomiting   Levofloxacin     Back spasms   Sulfa Antibiotics     Family History: Family History  Problem Relation Age of Onset   Cancer Mother        brain tumor   Cancer Father        Non Hodgkins lymphoma   Alzheimer's disease Father    Hypertension Sister    Diabetes Sister    Diabetes Paternal Aunt     Diabetes Maternal Grandfather    Hypertension Paternal Grandmother    Heart disease Paternal Grandmother    Colon cancer Neg Hx     Social History:  reports that she has never smoked. She has never used smokeless tobacco. She reports that she does not drink alcohol and does not use drugs.  ROS: All other review of systems were reviewed and are negative except what is noted above in HPI  Physical Exam: BP (!) 174/96   Pulse 76   LMP 06/25/2011   Constitutional:  Alert and oriented, No acute distress. HEENT: [place] AT, moist mucus membranes.  Trachea midline, no masses. Cardiovascular: No clubbing, cyanosis, or edema. Respiratory: Normal respiratory effort, no increased work of breathing. GI: Abdomen is soft, nontender, nondistended, no abdominal masses GU: No CVA tenderness.  Lymph: No cervical or inguinal lymphadenopathy. Skin: No rashes, bruises or suspicious lesions. Neurologic: Grossly intact, no focal deficits, moving all 4 extremities. Psychiatric: Normal mood and affect.  Laboratory Data: Lab Results  Component Value Date   WBC 6.3 01/18/2023   HGB 14.5 01/18/2023   HCT 44.6 01/18/2023   MCV 90 01/18/2023   PLT 320 01/18/2023    Lab Results  Component Value Date   CREATININE 0.94 01/18/2023    No results found for: "PSA"  No results found for: "TESTOSTERONE"  No results found for: "HGBA1C"  Urinalysis    Component Value Date/Time   COLORURINE YELLOW 12/29/2013 1536   APPEARANCEUR Clear 05/29/2023 1427   LABSPEC 1.005 12/29/2013 1536   PHURINE 6.0 12/29/2013 1536   GLUCOSEU Negative 05/29/2023 1427   HGBUR NEG 12/29/2013 1536   BILIRUBINUR Negative 05/29/2023 1427   KETONESUR NEG 12/29/2013 1536   PROTEINUR Negative 05/29/2023 1427   PROTEINUR NEG 12/29/2013 1536   UROBILINOGEN 0.2 12/29/2013 1536   NITRITE Negative 05/29/2023 1427   NITRITE NEG 12/29/2013 1536   LEUKOCYTESUR Negative 05/29/2023 1427    Lab Results  Component Value Date   LABMICR  See below: 05/29/2023   WBCUA None seen 05/29/2023   LABEPIT 0-10 05/29/2023   BACTERIA None seen 05/29/2023    Pertinent Imaging: KUB yesterday: Images reviewed and discussed with the patient Results for orders placed during the hospital encounter of 06/18/23  DG Abd 1 View  Narrative CLINICAL DATA:  Kidney stone.  EXAM: ABDOMEN - 1 VIEW  COMPARISON:  Radiograph 06/04/2023.  CT 05/27/2023  FINDINGS: Previous stone at the left ureteropelvic junction is not seen. No visualized renal calculi. No stone or stone fragments along the course of the ureter or in the expected location of the bladder. Normal bowel gas pattern. Small volume of formed stool in the colon.  IMPRESSION: Previous stone at the left ureteropelvic junction is not seen. No visualized renal or ureteral calculi.   Electronically Signed By:  Chadwick Colonel M.D. On: 06/18/2023 15:49  No results found for this or any previous visit.  No results found for this or any previous visit.  No results found for this or any previous visit.  No results found for this or any previous visit.  No results found for this or any previous visit.  No results found for this or any previous visit.  No results found for this or any previous visit.   Assessment & Plan:    1. Kidney stones (Primary) Dietary handout given  - Urinalysis, Routine w reflex microscopic   No follow-ups on file.  Johnie Nailer, MD  [facility] [place]

## 2023-06-28 LAB — CALCULI, WITH PHOTOGRAPH (CLINICAL LAB)
Calcium Oxalate Dihydrate: 80 %
Calcium Oxalate Monohydrate: 20 %
Weight Calculi: 146 mg

## 2023-08-07 ENCOUNTER — Telehealth: Payer: Self-pay | Admitting: *Deleted

## 2023-08-07 ENCOUNTER — Encounter: Payer: Self-pay | Admitting: *Deleted

## 2023-08-07 ENCOUNTER — Other Ambulatory Visit: Payer: Self-pay

## 2023-08-07 DIAGNOSIS — K588 Other irritable bowel syndrome: Secondary | ICD-10-CM

## 2023-08-07 DIAGNOSIS — K589 Irritable bowel syndrome without diarrhea: Secondary | ICD-10-CM | POA: Insufficient documentation

## 2023-08-07 NOTE — Patient Outreach (Addendum)
 Complex Care Management   Visit Note  08/07/2023  Name:  Melanie Oliver MRN: [id number] DOB: 05-Oct-1960  Situation: Referral received for Complex Care Management related to closure of care gaps I obtained verbal consent from Patient.  Visit completed with Melanie Melanie Oliver   on the phone  Melanie Oliver confirms she is having improvement of her sore throat, clogged ears & cough she was recently evaluated for. She confirms she is almost finished her antibiotics Melanie Oliver confirms she has obtain only the vaccines she prefers Does not prefer pneumonia Has not obtained shingles shot at this time  Background:   Past Medical History:  Diagnosis Date   Chiari malformation type I (HCC)    Complication of anesthesia    woke up with a migraine   Family history of anesthesia complication    aunt wakes up with a migraine   GERD (gastroesophageal reflux disease)    Headache(784.0)    Hypertension    Neuromuscular disorder (HCC)    pinched nerve in neck   Other and unspecified general anesthetics causing adverse effect in therapeutic use    Has Chiari malformation- per neurologist do not over extend neck     Assessment: Patient Reported Symptoms:  Cognitive Cognitive Status: Able to follow simple commands Cognitive/Intellectual Conditions Management [RPT]: None reported or documented in medical history or problem list   Health Maintenance Behaviors: Immunizations, Sleep adequate, Social activities Healing Pattern: Average Health Facilitated by: Pain control, Rest  Neurological Neurological Review of Symptoms: No symptoms reported Neurological Management Strategies: Adequate rest, Routine screening Neurological Self-Management Outcome: 4 (good)  HEENT HEENT Symptoms Reported: Sore throat, Other: Other HEENT Symptoms/Conditions: ear clogged, cough, sore throat recently Today reports improvements HEENT Conditions: Ear problem(s) HEENT Management Strategies: Medication  therapy, Routine screening HEENT Self-Management Outcome: 3 (uncertain) HEENT Comment: reports improvement Almost finished her antibiotics Ear problem(s)  Cardiovascular Cardiovascular Symptoms Reported: No symptoms reported Does patient have uncontrolled Hypertension?: No Cardiovascular Conditions: Hypertension Cardiovascular Management Strategies: Adequate rest, Routine screening Weight: 195 lb (88.5 kg) Cardiovascular Self-Management Outcome: 4 (good)  Respiratory Respiratory Symptoms Reported: Dry cough, Other: Other Respiratory Symptoms: negative for flu /covid Respiratory Conditions: Cough Respiratory Self-Management Outcome: 3 (uncertain) Respiratory Comment: improving  Endocrine Patient reports the following symptoms related to hypoglycemia or hyperglycemia : No symptoms reported Is patient diabetic?: No Endocrine Self-Management Outcome: 4 (good)  Gastrointestinal Gastrointestinal Symptoms Reported: Irritable bowel syndrome Gastrointestinal Conditions: Irritable bowel syndrome, Reflux/heartburn Gastrointestinal Management Strategies: Activity, Adequate rest Gastrointestinal Self-Management Outcome: 4 (good) Nutrition Risk Screen (CP): No indicators present  Genitourinary Genitourinary Symptoms Reported: No symptoms reported Genitourinary Conditions: Urinary tract infection Genitourinary Management Strategies: Adequate rest, Fluid modification, Activity Genitourinary Self-Management Outcome: 4 (good)  Integumentary Integumentary Symptoms Reported: No symptoms reported Skin Management Strategies: Adequate rest, Routine screening Skin Self-Management Outcome: 4 (good)  Musculoskeletal Musculoskelatal Symptoms Reviewed: No symptoms reported Additional Musculoskeletal Details: low back pain & muscle spasms, left flank pain Musculoskeletal Conditions: Fracture Musculoskeletal Management Strategies: Adequate rest, Activity, Medication therapy, Routine screening      Psychosocial  Psychosocial Symptoms Reported: No symptoms reported Behavioral Management Strategies: Adequate rest, Support system Behavioral Health Self-Management Outcome: 4 (good) Major Change/Loss/Stressor/Fears (CP): Medical condition, self Techniques to Cope with Loss/Stress/Change: Diversional activities Quality of Family Relationships: helpful, supportive Do you feel physically threatened by others?: No      08/07/2023    3:22 PM  Depression screen PHQ 2/9  Decreased Interest 0  Down, Depressed, Hopeless 0  PHQ - 2  Score 0    Vitals:   08/07/23 1515  BP: (!) 138/92  Pulse: 71  SpO2: 97%    Medications Reviewed Today     Reviewed by Arlyce Berger, RN (Registered Nurse) on 08/07/23 at 1523  Med List Status: <None>   Medication Order Taking? Sig Documenting Provider Last Dose Status Informant  azithromycin (ZITHROMAX) 250 MG tablet [id number] Yes Take by mouth. [provider]  Active   cholecalciferol (VITAMIN D3) 25 MCG (1000 UNIT) tablet [id number]  Take 1,000 Units by mouth daily. [provider]  Active Self  dicyclomine  (BENTYL ) 10 MG capsule [id number]  Take 1 capsule (10 mg total) by mouth 2 (two) times daily. Vinetta Greening, DO  Active   esomeprazole  (NEXIUM ) 20 MG capsule [id number] Yes Take 1 capsule (20 mg total) by mouth 2 (two) times daily before a meal. Vinetta Greening, DO Taking Active   HYDROcodone -acetaminophen  (NORCO/VICODIN) 5-325 MG tablet [id number]  Take 1 tablet by mouth every 6 (six) hours as needed for moderate pain (pain score 4-6) or severe pain (pain score 7-10). McKenzie, Arden Beck, MD  Active   ondansetron  (ZOFRAN ) 4 MG tablet [id number]  Take 1 tablet (4 mg total) by mouth daily as needed for nausea or vomiting. McKenzie, Arden Beck, MD  Active   oxyCODONE -acetaminophen  (PERCOCET) 5-325 MG tablet [id number]  Take 1 tablet by mouth every 4 (four) hours as needed. McKenzie, Arden Beck, MD  Active   tamsulosin  (FLOMAX ) 0.4 MG CAPS capsule  [id number]  Take 1 capsule (0.4 mg total) by mouth daily after supper. McKenzie, Arden Beck, MD  Active             Recommendation:   Continue Current Plan of Care Discuss the pneumonia shot and colon screening with pcp, Shingles shot can be obtain at drug stores like [facility]  Follow Up Plan:   Telephone follow-up in 90 days  Jessicia Napolitano L. Mcarthur Speedy, RN, BSN, CCM [place]  [facility], [facility] Health RN Care Manager Direct Dial: [phone]  Fax: [phone]

## 2023-08-07 NOTE — Patient Instructions (Signed)
 Visit Information  Thank you for taking time to visit with me today. Please don't hesitate to contact me if I can be of assistance to you before our next scheduled appointment.  Our next appointment is by telephone on 10/07/23 at 2 pm Please call the care guide team at [phone] if you need to cancel or reschedule your appointment.   Following is a copy of your care plan:   Goals Addressed             This Visit's Progress    close care gaps-VBCI RN Care Plan   Improving    Problems:  Chronic Disease Management support and education needs related to care gaps/recent upper respiratory symptoms (cough, clogged ears sore throat Preference for not receiving various vaccinations to include Shingles, pneumonia,   Goal: Over the next 90 days the Patient will work with RN CM  to complete care gaps as evidenced by review of electronic medical record and patient or care team member report     Interventions:   Health Maintenance Interventions: Patient interviewed about adult health maintenance status including  Depression screen    Falls risk assessment    TDAP Vaccine    Zostavax Pneumonia Vaccine Influenza Vaccine COVID vaccination    Regular eye checkups Regular Dental Care    Advanced Directives    Patient provided CDC guideline information about shingles & pneumonia vaccines Provided education about 40+ preventive care Discussed the importance of receiving vaccines  Patient Self-Care Activities:  Attend all scheduled provider appointments Call provider office for new concerns or questions  Take medications as prescribed   Inquire about vaccines during next pcp office visits  Plan:  The care management team will reach out to the patient again over the next 90 days.             Please call the Suicide and Crisis Lifeline: 988 call the [place]  National Suicide Prevention Lifeline: (336) 756-2078 or TTY: 385 453 8657 TTY (603) 700-3300) to talk to a trained  counselor call 1-800-273-TALK (toll free, 24 hour hotline) call the [facility]: 401-425-8769 call 911 if you are experiencing a Mental Health or Behavioral Health Crisis or need someone to talk to.  Patient verbalizes understanding of instructions and care plan provided today and agrees to view in MyChart. Active MyChart status and patient understanding of how to access instructions and care plan via MyChart confirmed with patient.     Dawaun Brancato L. Mcarthur Speedy, RN, BSN, CCM [place]  [facility], [facility] Health RN Care Manager Direct Dial: [phone]  Fax: [phone]

## 2023-08-21 ENCOUNTER — Other Ambulatory Visit: Payer: Self-pay

## 2023-08-21 ENCOUNTER — Telehealth: Payer: Self-pay | Admitting: *Deleted

## 2023-08-21 NOTE — Patient Instructions (Signed)
 Visit Information  Thank you for taking time to visit with me today. Please don't hesitate to contact me if I can be of assistance to you before our next scheduled appointment.  Your next care management appointment is by telephone on 10/07/23 at 2 pm    Please call the care guide team at [phone] if you need to cancel, schedule, or reschedule an appointment.   Please call the Suicide and Crisis Lifeline: 988 call the [place]  National Suicide Prevention Lifeline: 618-312-0393 or TTY: (272)461-6511 TTY 807-487-3760) to talk to a trained counselor call 1-800-273-TALK (toll free, 24 hour hotline) call the [facility]: (636) 331-0094 call 911 if you are experiencing a Mental Health or Behavioral Health Crisis or need someone to talk to.   Hetvi Shawhan L. Ramonita, RN, BSN, CCM [place]  [facility], [facility] Health RN Care Manager Direct Dial: [phone]  Fax: [phone]

## 2023-08-21 NOTE — Patient Outreach (Signed)
 Further outreach to patient to follow up on her strep throat. Confirmed her strept test with negative She reports she is doing much better She denies symptoms She states she will be a future patient at [facility] after September 2025. She voiced understanding that this RN CCM will not be at this office and that another [facility]I RN CCM will provide services  Therma Lasure L. Ramonita, RN, BSN, CCM [place]  [facility], [facility] Health RN Care Manager Direct Dial: [phone]  Fax: [phone]

## 2023-08-26 ENCOUNTER — Encounter: Payer: Self-pay | Admitting: *Deleted

## 2023-09-20 ENCOUNTER — Ambulatory Visit (HOSPITAL_COMMUNITY)
Admission: RE | Admit: 2023-09-20 | Discharge: 2023-09-20 | Disposition: A | Discharge status: Home / Self Care | Source: Ambulatory Visit | Attending: Urology | Admitting: Urology

## 2023-09-20 DIAGNOSIS — N200 Calculus of kidney: Secondary | ICD-10-CM | POA: Insufficient documentation

## 2023-09-27 ENCOUNTER — Encounter: Payer: Self-pay | Admitting: Urology

## 2023-09-27 ENCOUNTER — Ambulatory Visit: Admitting: Urology

## 2023-09-27 VITALS — BP 181/77 | HR 70

## 2023-09-27 DIAGNOSIS — N200 Calculus of kidney: Secondary | ICD-10-CM | POA: Diagnosis not present

## 2023-09-27 LAB — URINALYSIS, ROUTINE W REFLEX MICROSCOPIC
Bilirubin, UA: NEGATIVE
Glucose, UA: NEGATIVE
Ketones, UA: NEGATIVE
Leukocytes,UA: NEGATIVE
Nitrite, UA: NEGATIVE
Protein,UA: NEGATIVE
RBC, UA: NEGATIVE
Specific Gravity, UA: 1.02 (ref 1.005–1.030)
Urobilinogen, Ur: 0.2 mg/dL (ref 0.2–1.0)
pH, UA: 6 (ref 5.0–7.5)

## 2023-09-27 NOTE — Addendum Note (Signed)
 Addended by: Buel Molder L on: 09/27/2023 12:56 PM   Modules accepted: Orders

## 2023-09-27 NOTE — Progress Notes (Signed)
 09/27/2023 12:50 PM   Melanie Oliver Signs Apr 04, 1960 [id number]  Referring provider: Marvine Rush, MD [address] [place],  [place] [address]  Followup nephrolithiasis   HPI: [name] is a 62yo here for followup for nephrolithiasis. No stone events since last visit. She denies any flank pain. She drinks 40-60oz of water  daily and 0.5-1 sundrop a day. No significant LUTS.    PMH: Past Medical History:  Diagnosis Date   Chiari malformation type I (HCC)    Complication of anesthesia    woke up with a migraine   Family history of anesthesia complication    aunt wakes up with a migraine   GERD (gastroesophageal reflux disease)    Headache(784.0)    Hypertension    Neuromuscular disorder (HCC)    pinched nerve in neck   Other and unspecified general anesthetics causing adverse effect in therapeutic use    Has Chiari malformation- per neurologist do not over extend neck     Surgical History: Past Surgical History:  Procedure Laterality Date   BIOPSY  06/17/2020   Procedure: BIOPSY;  Surgeon: Cindie Carlin POUR, DO;  [facility];  Service: Endoscopy;;   COLONOSCOPY N/A 10/27/2013   Procedure: COLONOSCOPY;  Surgeon: Oneil DELENA Budge, MD;  [facility];  Service: Gastroenterology;  Laterality: N/A;   COLONOSCOPY WITH PROPOFOL  N/A 06/17/2020   Carver: Nonbleeding internal hemorrhoids, diverticulosis, 2 mm tubular adenoma removed, random colon biopsies negative.  Next colonoscopy 5 years.   ESOPHAGOGASTRODUODENOSCOPY (EGD) WITH PROPOFOL  N/A 06/17/2020   Carver: Medium sized hiatal hernia, LA grade C reflux esophagitis, reactive gastropathy, no H. pylori.  Esophageal mucosal changes suspicious for eosinophilic esophagitis but biopsies were benign.   EXTRACORPOREAL SHOCK WAVE LITHOTRIPSY Left 06/04/2023   Procedure: LITHOTRIPSY, ESWL;  Surgeon: Sherrilee Belvie CROME, MD;  Location: [facility];  Service: Urology;  Laterality: Left;   MOUTH SURGERY      POLYPECTOMY  06/17/2020   Procedure: POLYPECTOMY;  Surgeon: Cindie Carlin POUR, DO;  [facility];  Service: Endoscopy;;   VAGINAL HYSTERECTOMY  08/27/2011   Procedure: HYSTERECTOMY VAGINAL;  Surgeon: Evalene SHAUNNA Organ, MD;  Location: [facility];  Service: Gynecology;  Laterality: N/A;    Home Medications:  Allergies as of 09/27/2023       Reactions   Codeine Nausea And Vomiting   Levofloxacin    Back spasms   Sulfa Antibiotics         Medication List        Accurate as of September 27, 2023 12:50 PM. If you have any questions, ask your nurse or doctor.          STOP taking these medications    methylPREDNISolone 4 MG Tbpk tablet Commonly known as: MEDROL DOSEPAK   nitrofurantoin (macrocrystal-monohydrate) 100 MG capsule Commonly known as: MACROBID   oxyCODONE -acetaminophen  5-325 MG tablet Commonly known as: Percocet       TAKE these medications    azithromycin 250 MG tablet Commonly known as: ZITHROMAX Take by mouth.   baclofen 10 MG tablet Commonly known as: LIORESAL Take by mouth.   cholecalciferol 25 MCG (1000 UNIT) tablet Commonly known as: VITAMIN D3 Take 1,000 Units by mouth daily.   dicyclomine  10 MG capsule Commonly known as: BENTYL  Take 1 capsule (10 mg total) by mouth 2 (two) times daily.   esomeprazole  20 MG capsule Commonly known as: NEXIUM  Take 1 capsule (20 mg total) by mouth 2 (two) times daily before a meal.   HYDROcodone -acetaminophen   5-325 MG tablet Commonly known as: NORCO/VICODIN Take 1 tablet by mouth every 6 (six) hours as needed for moderate pain (pain score 4-6) or severe pain (pain score 7-10).   ibuprofen  200 MG tablet Commonly known as: ADVIL  Take by mouth.   methocarbamol 500 MG tablet Commonly known as: ROBAXIN Take by mouth.   ondansetron  4 MG tablet Commonly known as: Zofran  Take 1 tablet (4 mg total) by mouth daily as needed for nausea or vomiting.   tamsulosin  0.4 MG Caps capsule Commonly known as:  Flomax  Take 1 capsule (0.4 mg total) by mouth daily after supper.   traMADol 50 MG tablet Commonly known as: ULTRAM Take 50 mg by mouth every 6 (six) hours as needed.        Allergies:  Allergies  Allergen Reactions   Codeine Nausea And Vomiting   Levofloxacin     Back spasms   Sulfa Antibiotics     Family History: Family History  Problem Relation Age of Onset   Cancer Mother        brain tumor   Cancer Father        Non Hodgkins lymphoma   Alzheimer's disease Father    Hypertension Sister    Diabetes Sister    Diabetes Paternal Aunt    Diabetes Maternal Grandfather    Hypertension Paternal Grandmother    Heart disease Paternal Grandmother    Colon cancer Neg Hx     Social History:  reports that she has never smoked. She has never used smokeless tobacco. She reports that she does not drink alcohol and does not use drugs.  ROS: All other review of systems were reviewed and are negative except what is noted above in HPI  Physical Exam: BP (!) 181/77   Pulse 70   LMP 06/25/2011   Constitutional:  Alert and oriented, No acute distress. HEENT: [place] AT, moist mucus membranes.  Trachea midline, no masses. Cardiovascular: No clubbing, cyanosis, or edema. Respiratory: Normal respiratory effort, no increased work of breathing. GI: Abdomen is soft, nontender, nondistended, no abdominal masses GU: No CVA tenderness.  Lymph: No cervical or inguinal lymphadenopathy. Skin: No rashes, bruises or suspicious lesions. Neurologic: Grossly intact, no focal deficits, moving all 4 extremities. Psychiatric: Normal mood and affect.  Laboratory Data: Lab Results  Component Value Date   WBC 6.3 01/18/2023   HGB 14.5 01/18/2023   HCT 44.6 01/18/2023   MCV 90 01/18/2023   PLT 320 01/18/2023    Lab Results  Component Value Date   CREATININE 0.94 01/18/2023    No results found for: PSA  No results found for: TESTOSTERONE  No results found for: HGBA1C  Urinalysis     Component Value Date/Time   COLORURINE YELLOW 12/29/2013 1536   APPEARANCEUR Clear 06/19/2023 1145   LABSPEC 1.005 12/29/2013 1536   PHURINE 6.0 12/29/2013 1536   GLUCOSEU Negative 06/19/2023 1145   HGBUR NEG 12/29/2013 1536   BILIRUBINUR Negative 06/19/2023 1145   KETONESUR NEG 12/29/2013 1536   PROTEINUR Negative 06/19/2023 1145   PROTEINUR NEG 12/29/2013 1536   UROBILINOGEN 0.2 12/29/2013 1536   NITRITE Negative 06/19/2023 1145   NITRITE NEG 12/29/2013 1536   LEUKOCYTESUR Negative 06/19/2023 1145    Lab Results  Component Value Date   LABMICR Comment 06/19/2023   WBCUA None seen 05/29/2023   LABEPIT 0-10 05/29/2023   BACTERIA None seen 05/29/2023    Pertinent Imaging: KUB 09/20/2023: Images reviewed and discussed with the patient Results for orders placed during the  hospital encounter of 09/20/23  Abdomen 1 view (KUB)  Narrative CLINICAL DATA:  Nephrolithiasis.  EXAM: ABDOMEN - 1 VIEW  COMPARISON:  06/18/2023  FINDINGS: No visualized urolithiasis. Normal bowel gas pattern with small volume of formed stool in the colon. No evidence of obstruction. No acute osseous findings.  IMPRESSION: No visualized urolithiasis.   Electronically Signed By: Andrea Gasman M.D. On: 09/26/2023 19:46  No results found for this or any previous visit.  No results found for this or any previous visit.  No results found for this or any previous visit.  No results found for this or any previous visit.  No results found for this or any previous visit.  No results found for this or any previous visit.  No results found for this or any previous visit.   Assessment & Plan:    1. Kidney stones (Primary) -followup 6 months with KUB - Urinalysis, Routine w reflex microscopic   No follow-ups on file.  Belvie Clara, MD  [facility] [place]

## 2023-09-27 NOTE — Patient Instructions (Signed)

## 2023-10-07 ENCOUNTER — Other Ambulatory Visit: Payer: Self-pay | Admitting: *Deleted

## 2023-10-07 ENCOUNTER — Encounter: Payer: Self-pay | Admitting: *Deleted

## 2023-10-07 NOTE — Patient Outreach (Signed)
 Complex Care Management   Visit Note  10/07/2023  Name:  Melanie Oliver MRN: [id number] DOB: 1960-11-04  Situation: Referral received for Complex Care Management related to care gaps I obtained verbal consent from Patient.  Visit completed with Cassondra GORMAN Signs  on the phone  Mamaogram completed in January 2025 Flu completed in October 2024  Gave blood July 2025 and screened for HIV & hepatitis  Unable to enter into epic related to topic will be discontinued message   She will request to get the Shingrix, pneumonia, DTaP vaccine during her next pcp visit- MD sent a note   Confirmed she is transferring to [place]  When [place] office closes  Background:   Past Medical History:  Diagnosis Date   Chiari malformation type I (HCC)    Complication of anesthesia    woke up with a migraine   Family history of anesthesia complication    aunt wakes up with a migraine   GERD (gastroesophageal reflux disease)    Headache(784.0)    Hypertension    Neuromuscular disorder (HCC)    pinched nerve in neck   Other and unspecified general anesthetics causing adverse effect in therapeutic use    Has Chiari malformation- per neurologist do not over extend neck     Assessment: Patient Reported Symptoms:  Cognitive Cognitive Status: Alert and oriented to person, place, and time, Insightful and able to interpret abstract concepts, Normal speech and language skills      Neurological Neurological Review of Symptoms: No symptoms reported Neurological Self-Management Outcome: 4 (good)  HEENT HEENT Symptoms Reported: No symptoms reported HEENT Self-Management Outcome: 4 (good)    Cardiovascular Cardiovascular Symptoms Reported: No symptoms reported Cardiovascular Self-Management Outcome: 4 (good)  Respiratory Respiratory Symptoms Reported: No symptoms reported Respiratory Self-Management Outcome: 4 (good)  Endocrine Endocrine Symptoms Reported: No symptoms reported Endocrine  Self-Management Outcome: 4 (good)  Gastrointestinal Gastrointestinal Symptoms Reported: No symptoms reported Gastrointestinal Self-Management Outcome: 4 (good) Nutrition Risk Screen (CP): No indicators present  Genitourinary Genitourinary Symptoms Reported: No symptoms reported Genitourinary Self-Management Outcome: 4 (good)  Integumentary Integumentary Symptoms Reported: No symptoms reported Skin Self-Management Outcome: 4 (good)  Musculoskeletal Musculoskelatal Symptoms Reviewed: No symptoms reported Musculoskeletal Self-Management Outcome: 4 (good) Falls in the past year?: No Number of falls in past year: 1 or less Was there an injury with Fall?: No Fall Risk Category Calculator: 0 Patient Fall Risk Level: Low Fall Risk Patient at Risk for Falls Due to: No Fall Risks Fall risk Follow up: Falls evaluation completed  Psychosocial Psychosocial Symptoms Reported: No symptoms reported Behavioral Health Self-Management Outcome: 4 (good)   Quality of Family Relationships: helpful, supportive Do you feel physically threatened by others?: No      10/07/2023    2:26 PM  Depression screen PHQ 2/9  Decreased Interest 0  Down, Depressed, Hopeless 0  PHQ - 2 Score 0    There were no vitals filed for this visit.  Medications Reviewed Today   Medications were not reviewed in this encounter     Recommendation:   PCP Follow-up Continue Current Plan of Care Follow up at the new PCP office on further needed care gap closure Consider a nutritional consult  Follow Up Plan:   Closing From:  Complex Care Management  Shaana Acocella L. Ramonita, RN, BSN, CCM [place]  [facility], [facility] RN Care Manager Direct Dial: [phone]  Fax: [phone]

## 2023-10-07 NOTE — Patient Instructions (Signed)
 Visit Information  Thank you for taking time to visit with me today. Please don't hesitate to contact me if I can be of assistance to you before our next scheduled appointment.  Your next care management appointment is no further scheduled appointments.     Please call the care guide team at [phone] if you need to cancel, schedule, or reschedule an appointment.   Please call the Suicide and Crisis Lifeline: 988 call the [place]  National Suicide Prevention Lifeline: 5701443666 or TTY: 520-263-3535 TTY 9893370527) to talk to a trained counselor call 1-800-273-TALK (toll free, 24 hour hotline) call the [facility]: 256-245-4812 call 911 if you are experiencing a Mental Health or Behavioral Health Crisis or need someone to talk to.  Baraa Tubbs L. Ramonita, RN, BSN, CCM [place]  [facility], [facility] RN Care Manager Direct Dial: [phone]  Fax: [phone]

## 2024-03-27 ENCOUNTER — Ambulatory Visit (HOSPITAL_COMMUNITY)
Admission: RE | Admit: 2024-03-27 | Discharge: 2024-03-27 | Disposition: A | Source: Ambulatory Visit | Attending: Urology

## 2024-03-27 DIAGNOSIS — N200 Calculus of kidney: Secondary | ICD-10-CM | POA: Insufficient documentation

## 2024-04-03 ENCOUNTER — Encounter: Payer: Self-pay | Admitting: Urology

## 2024-04-03 ENCOUNTER — Ambulatory Visit: Admitting: Urology

## 2024-04-03 VITALS — BP 131/81

## 2024-04-03 DIAGNOSIS — N200 Calculus of kidney: Secondary | ICD-10-CM

## 2024-04-03 LAB — URINALYSIS, ROUTINE W REFLEX MICROSCOPIC
Bilirubin, UA: NEGATIVE
Glucose, UA: NEGATIVE
Ketones, UA: NEGATIVE
Leukocytes,UA: NEGATIVE
Nitrite, UA: NEGATIVE
Protein,UA: NEGATIVE
RBC, UA: NEGATIVE
Specific Gravity, UA: <1.005 — ABNORMAL LOW (ref 1.005–1.030)
Urobilinogen, Ur: 0.2 mg/dL (ref 0.2–1.0)
pH, UA: 6.5 (ref 5.0–7.5)

## 2024-04-03 NOTE — Progress Notes (Signed)
 "  04/03/2024 12:51 PM   Melanie Oliver Signs [birth year] [id number]  Referring provider: Marvine Rush, MD [address] [place],  [place] [address]  Followup nephrolithiasis   HPI: [name] is a 63yo here for followup for nephrolithiasis. No stones events since last visit. No flank pain. She drinks 48oz of water  at work and 1-2 sundrops after work. KUb shows no calculi   PMH: Past Medical History:  Diagnosis Date   Chiari malformation type I (HCC)    Complication of anesthesia    woke up with a migraine   Family history of anesthesia complication    aunt wakes up with a migraine   GERD (gastroesophageal reflux disease)    Headache(784.0)    Hypertension    Neuromuscular disorder (HCC)    pinched nerve in neck   Other and unspecified general anesthetics causing adverse effect in therapeutic use    Has Chiari malformation- per neurologist do not over extend neck     Surgical History: Past Surgical History:  Procedure Laterality Date   BIOPSY  06/17/2020   Procedure: BIOPSY;  Surgeon: Cindie Carlin POUR, DO;  [facility];  Service: Endoscopy;;   COLONOSCOPY N/A 10/27/2013   Procedure: COLONOSCOPY;  Surgeon: Oneil DELENA Budge, MD;  [facility];  Service: Gastroenterology;  Laterality: N/A;   COLONOSCOPY WITH PROPOFOL  N/A 06/17/2020   Carver: Nonbleeding internal hemorrhoids, diverticulosis, 2 mm tubular adenoma removed, random colon biopsies negative.  Next colonoscopy 5 years.   ESOPHAGOGASTRODUODENOSCOPY (EGD) WITH PROPOFOL  N/A 06/17/2020   Carver: Medium sized hiatal hernia, LA grade C reflux esophagitis, reactive gastropathy, no H. pylori.  Esophageal mucosal changes suspicious for eosinophilic esophagitis but biopsies were benign.   EXTRACORPOREAL SHOCK WAVE LITHOTRIPSY Left 06/04/2023   Procedure: LITHOTRIPSY, ESWL;  Surgeon: Sherrilee Belvie CROME, MD;  Location: [facility];  Service: Urology;  Laterality: Left;   MOUTH SURGERY     POLYPECTOMY   06/17/2020   Procedure: POLYPECTOMY;  Surgeon: Cindie Carlin POUR, DO;  [facility];  Service: Endoscopy;;   VAGINAL HYSTERECTOMY  08/27/2011   Procedure: HYSTERECTOMY VAGINAL;  Surgeon: Evalene SHAUNNA Organ, MD;  [facility];  Service: Gynecology;  Laterality: N/A;    Home Medications:  Allergies as of 04/03/2024       Reactions   Codeine Nausea And Vomiting   Levofloxacin    Back spasms   Sulfa Antibiotics         Medication List        Accurate as of April 03, 2024 12:51 PM. If you have any questions, ask your nurse or doctor.          azithromycin 250 MG tablet Commonly known as: ZITHROMAX Take by mouth.   baclofen 10 MG tablet Commonly known as: LIORESAL Take by mouth.   cholecalciferol 25 MCG (1000 UNIT) tablet Commonly known as: VITAMIN D3 Take 1,000 Units by mouth daily.   dicyclomine  10 MG capsule Commonly known as: BENTYL  Take 1 capsule (10 mg total) by mouth 2 (two) times daily.   esomeprazole  20 MG capsule Commonly known as: NEXIUM  Take 1 capsule (20 mg total) by mouth 2 (two) times daily before a meal.   HYDROcodone -acetaminophen  5-325 MG tablet Commonly known as: NORCO/VICODIN Take 1 tablet by mouth every 6 (six) hours as needed for moderate pain (pain score 4-6) or severe pain (pain score 7-10).   ibuprofen  200 MG tablet Commonly known as: ADVIL  Take by mouth.   methocarbamol 500  MG tablet Commonly known as: ROBAXIN Take by mouth.   ondansetron  4 MG tablet Commonly known as: Zofran  Take 1 tablet (4 mg total) by mouth daily as needed for nausea or vomiting.   tamsulosin  0.4 MG Caps capsule Commonly known as: Flomax  Take 1 capsule (0.4 mg total) by mouth daily after supper.   traMADol 50 MG tablet Commonly known as: ULTRAM Take 50 mg by mouth every 6 (six) hours as needed.        Allergies: Allergies[1]  Family History: Family History  Problem Relation Age of Onset   Cancer Mother        brain tumor   Cancer  Father        Non Hodgkins lymphoma   Alzheimer's disease Father    Hypertension Sister    Diabetes Sister    Diabetes Paternal Aunt    Diabetes Maternal Grandfather    Hypertension Paternal Grandmother    Heart disease Paternal Grandmother    Colon cancer Neg Hx     Social History:  reports that she has never smoked. She has never used smokeless tobacco. She reports that she does not drink alcohol and does not use drugs.  ROS: All other review of systems were reviewed and are negative except what is noted above in HPI  Physical Exam: BP 131/81   LMP 06/25/2011   Constitutional:  Alert and oriented, No acute distress. HEENT: [place] AT, moist mucus membranes.  Trachea midline, no masses. Cardiovascular: No clubbing, cyanosis, or edema. Respiratory: Normal respiratory effort, no increased work of breathing. GI: Abdomen is soft, nontender, nondistended, no abdominal masses GU: No CVA tenderness.  Lymph: No cervical or inguinal lymphadenopathy. Skin: No rashes, bruises or suspicious lesions. Neurologic: Grossly intact, no focal deficits, moving all 4 extremities. Psychiatric: Normal mood and affect.  Laboratory Data: Lab Results  Component Value Date   WBC 6.3 01/18/2023   HGB 14.5 01/18/2023   HCT 44.6 01/18/2023   MCV 90 01/18/2023   PLT 320 01/18/2023    Lab Results  Component Value Date   CREATININE 0.94 01/18/2023    No results found for: PSA  No results found for: TESTOSTERONE  No results found for: HGBA1C  Urinalysis    Component Value Date/Time   COLORURINE YELLOW 12/29/2013 1536   APPEARANCEUR Clear 09/27/2023 1308   LABSPEC 1.005 12/29/2013 1536   PHURINE 6.0 12/29/2013 1536   GLUCOSEU Negative 09/27/2023 1308   HGBUR NEG 12/29/2013 1536   BILIRUBINUR Negative 09/27/2023 1308   KETONESUR NEG 12/29/2013 1536   PROTEINUR Negative 09/27/2023 1308   PROTEINUR NEG 12/29/2013 1536   UROBILINOGEN 0.2 12/29/2013 1536   NITRITE Negative 09/27/2023 1308    NITRITE NEG 12/29/2013 1536   LEUKOCYTESUR Negative 09/27/2023 1308    Lab Results  Component Value Date   LABMICR Comment 09/27/2023   WBCUA None seen 05/29/2023   LABEPIT 0-10 05/29/2023   BACTERIA None seen 05/29/2023    Pertinent Imaging: KUb 03/27/24: Images reviewed and discussed with the patient  Results for orders placed during the hospital encounter of 03/27/24  Abdomen 1 view (KUB)  Narrative CLINICAL DATA:  Nephrolithiasis.  EXAM: ABDOMEN - 1 VIEW  COMPARISON:  09/20/2023  FINDINGS: No stones project over the kidneys, course of the ureters or in the bladder. Stable right pelvic phlebolith. Normal bowel gas pattern with moderate colonic stool burden. Hiatal hernia partially included in the field of view.  IMPRESSION: 1. No radiographic evidence of urolithiasis. 2. Hiatal hernia.   Electronically  Signed By: Andrea Gasman M.D. On: 03/29/2024 17:30  No results found for this or any previous visit.  No results found for this or any previous visit.  No results found for this or any previous visit.  No results found for this or any previous visit.  No results found for this or any previous visit.  No results found for this or any previous visit.  No results found for this or any previous visit.   Assessment & Plan:    1. Kidney stones (Primary) -dietary handout given -followup 1 year with KUB - Urinalysis, Routine w reflex microscopic   No follow-ups on file.  Belvie Clara, MD  [facility] [place]      [1]  Allergies Allergen Reactions   Codeine Nausea And Vomiting   Levofloxacin     Back spasms   Sulfa Antibiotics    "

## 2024-04-03 NOTE — Patient Instructions (Signed)
 Preventing Kidney Stones: Eating Plan Kidney stones are deposits of minerals and salts that form inside your kidneys. Your risk of developing kidney stones may be greater depending on your diet, your lifestyle, the medicines you take, and whether you have certain medical conditions. Most people can lower their risks of developing kidney stones by following these dietary guidelines. Your dietitian may give you more specific instructions depending on your overall health and the type of kidney stones you tend to develop. What are tips for following this plan? Reading food labels  Choose foods with no salt added or low-salt labels. Limit your salt (sodium) intake to less than 1,500 mg a day. Choose foods with calcium for each meal and snack. Try to eat about 300 mg of calcium at each meal. Foods that contain 200-500 mg of calcium a serving include: 8 oz (237 mL) of milk, calcium-fortifiednon-dairy milk, and calcium-fortifiedfruit juice. Calcium-fortified means that calcium has been added to these drinks. 8 oz (237 mL) of kefir, yogurt, and soy yogurt. 4 oz (114 g) of tofu. 1 oz (28 g) of cheese. 1 cup (150 g) of dried figs. 1 cup (91 g) of cooked broccoli. One 3 oz (85 g) can of sardines or mackerel. Most people need 1,000-1,500 mg of calcium a day. Talk to your dietitian about how much calcium is recommended for you. Shopping Buy plenty of fresh fruits and vegetables. Most people do not need to avoid fruits and vegetables, even if these foods contain nutrients that may contribute to kidney stones. When shopping for convenience foods, choose: Whole pieces of fruit. Pre-made salads with dressing on the side. Low-fat fruit and yogurt smoothies. Avoid buying frozen meals or prepared deli foods. These can be high in sodium. Look for foods with live cultures, such as yogurt and kefir. Choose high-fiber grains, such as whole-wheat breads, oat bran, and wheat cereals. Cooking Do not add salt to  food when cooking. Place a salt shaker on the table and allow each person to add their own salt to taste. Use vegetable protein, such as beans, textured vegetable protein (TVP), or tofu, instead of meat in pasta, casseroles, and soups. Meal planning Eat less salt, if told by your dietitian. To do this: Avoid eating processed or pre-made food. Avoid eating fast food. Eat less animal protein, including cheese, meat, poultry, or fish, if told by your dietitian. To do this: Limit the number of times you have meat, poultry, fish, or cheese each week. Eat a diet free of meat at least 2 days a week. Eat only one serving each day of meat, poultry, fish, or seafood. When you prepare animal proteins, cut pieces into small portion sizes. For most meat and fish, one serving is about the size of the palm of your hand. Eat at least five servings of fresh fruits and vegetables each day. To do this: Keep fruits and vegetables on hand for snacks. Eat one piece of fruit or a handful of berries with breakfast. Have a salad and fruit at lunch. Have two kinds of vegetables at dinner. You may be told to limit foods that are high in a substance called oxalate. These include: Spinach (cooked), rhubarb, beets, sweet potatoes, and Swiss chard. Peanuts. Potato chips, french fries, and baked potatoes with skin on. Nuts and nut products. Chocolate. If you regularly take a diuretic medicine, make sure to eat at least 1 or 2 servings of fruits or vegetables that are high in potassium each day. These include: Avocado. Banana. Orange,  prune, carrot, or tomato juice. Baked potato. Cabbage. Beans and split peas. Lifestyle  Drink enough fluid to keep your urine pale yellow. This is the most important thing you can do. Spread your fluid intake throughout the day. If you drink alcohol: Limit how much you have to: 0-1 drink a day for women who are not pregnant. 0-2 drinks a day for men. Know how much alcohol is in your  drink. In the [place] one drink equals one 12 oz bottle of beer (355 mL), one 5 oz glass of wine (148 mL), or one 1 oz glass of hard liquor (44 mL). Lose weight if told by your health care provider. Work with your dietitian to find an eating plan and weight loss strategies that work best for you. General information Talk to your health care provider and dietitian about taking daily supplements. Depending on your health and the cause of your kidney stones, you may be told: Do not take high-dose supplements of vitamin C (1,000 mg a day or more). To take a calcium supplement. To take a daily probiotic supplement. To take other supplements such as magnesium, fish oil, or vitamin B6. Take over-the-counter and prescription medicines only as told by your health care provider. These include supplements. What foods should I limit? Limit your intake of the following foods, or eat them as told by your dietitian. Vegetables Spinach. Rhubarb. Beets. Canned vegetables. Dene. Olives. Baked potatoes with skin. Grains Wheat bran. Baked goods. Salted crackers. Cereals high in sugar. Meats and other proteins Nuts. Nut butters. Large portions of meat, poultry, or fish. Salted, precooked, or cured meats, such as sausages, meat loaves, and hot dogs. Dairy Cheeses. Beverages Regular soft drinks. Regular vegetable juice. Seasonings and condiments Seasoning blends with salt. Salad dressings. Soy sauce. Ketchup. Barbecue sauce. Other foods Canned soups. Canned pasta sauce. Casseroles. Pizza. Lasagna. Frozen meals. Potato chips. French fries. The items listed above may not be a complete list of foods and beverages you should limit. Contact a dietitian for more information. What foods should I avoid? Talk to your dietitian about specific foods you should avoid based on the type of kidney stones you have and your overall health. Fruits Grapefruit. The item listed above may not be a complete list of foods and  beverages you should avoid. Contact a dietitian for more information. Summary Kidney stones are deposits of minerals and salts that form inside your kidneys. You can lower your risk of kidney stones by making changes to your diet. The most important thing you can do is drink enough fluid. Drink enough fluid to keep your urine pale yellow. Talk to your dietitian about how much calcium you should have each day, and eat less salt and animal protein as told by your dietitian. This information is not intended to replace advice given to you by your health care provider. Make sure you discuss any questions you have with your health care provider. Document Revised: 12/22/2023 Document Reviewed: 05/25/2021 [employer] Patient Education  2025 [employer].

## 2025-04-05 ENCOUNTER — Ambulatory Visit: Admitting: Urology
